# Patient Record
Sex: Male | Born: 1965 | Race: White | Hispanic: No | Smoking: Current every day smoker
Health system: Southern US, Community
[De-identification: ages and names within clinical notes are randomized; demographics above are authoritative.]

## PROBLEM LIST (undated history)

## (undated) DIAGNOSIS — R569 Unspecified convulsions: Secondary | ICD-10-CM

## (undated) DIAGNOSIS — Z720 Tobacco use: Secondary | ICD-10-CM

## (undated) DIAGNOSIS — F32A Depression, unspecified: Secondary | ICD-10-CM

## (undated) DIAGNOSIS — F1210 Cannabis abuse, uncomplicated: Secondary | ICD-10-CM

## (undated) HISTORY — PX: OTHER SURGICAL HISTORY: SHX169

## (undated) HISTORY — DX: Unspecified convulsions: R56.9

## (undated) HISTORY — DX: Depression, unspecified: F32.A

---

## 2015-12-28 DIAGNOSIS — E785 Hyperlipidemia, unspecified: Secondary | ICD-10-CM | POA: Insufficient documentation

## 2015-12-28 DIAGNOSIS — F17200 Nicotine dependence, unspecified, uncomplicated: Secondary | ICD-10-CM | POA: Insufficient documentation

## 2015-12-28 DIAGNOSIS — N529 Male erectile dysfunction, unspecified: Secondary | ICD-10-CM | POA: Insufficient documentation

## 2016-01-19 DIAGNOSIS — F32A Depression, unspecified: Secondary | ICD-10-CM | POA: Insufficient documentation

## 2019-10-01 DIAGNOSIS — J869 Pyothorax without fistula: Secondary | ICD-10-CM

## 2019-10-01 DIAGNOSIS — J90 Pleural effusion, not elsewhere classified: Secondary | ICD-10-CM

## 2019-10-01 NOTE — Plan of Care (Signed)
Hospitalist Accept Note:  Transfer from [place]. Patient with loculated right pleural effusion. WBC 23.9. Afebrile and normotensive. Unclear etiology for development of complex pleural effusion. Needs CT Surgery consult for chest tube placement.  Bennie Pierini, MD 10/01/19 11:56 PM

## 2019-10-02 ENCOUNTER — Encounter (HOSPITAL_COMMUNITY): Payer: Self-pay | Admitting: Internal Medicine

## 2019-10-02 ENCOUNTER — Inpatient Hospital Stay (HOSPITAL_COMMUNITY)
Admission: AD | Admit: 2019-10-02 | Discharge: 2019-10-12 | DRG: 164 | Disposition: A | Discharge status: Home / Self Care | Payer: Medicaid Other | Source: Other Acute Inpatient Hospital | Attending: Surgery | Admitting: Surgery

## 2019-10-02 ENCOUNTER — Other Ambulatory Visit: Payer: Self-pay

## 2019-10-02 DIAGNOSIS — J90 Pleural effusion, not elsewhere classified: Secondary | ICD-10-CM | POA: Diagnosis present

## 2019-10-02 DIAGNOSIS — F1210 Cannabis abuse, uncomplicated: Secondary | ICD-10-CM | POA: Diagnosis present

## 2019-10-02 DIAGNOSIS — Z825 Family history of asthma and other chronic lower respiratory diseases: Secondary | ICD-10-CM

## 2019-10-02 DIAGNOSIS — R161 Splenomegaly, not elsewhere classified: Secondary | ICD-10-CM | POA: Diagnosis present

## 2019-10-02 DIAGNOSIS — Z8249 Family history of ischemic heart disease and other diseases of the circulatory system: Secondary | ICD-10-CM

## 2019-10-02 DIAGNOSIS — K209 Esophagitis, unspecified without bleeding: Secondary | ICD-10-CM | POA: Diagnosis present

## 2019-10-02 DIAGNOSIS — Z20828 Contact with and (suspected) exposure to other viral communicable diseases: Secondary | ICD-10-CM | POA: Diagnosis present

## 2019-10-02 DIAGNOSIS — K3189 Other diseases of stomach and duodenum: Secondary | ICD-10-CM | POA: Diagnosis present

## 2019-10-02 DIAGNOSIS — R001 Bradycardia, unspecified: Secondary | ICD-10-CM | POA: Diagnosis not present

## 2019-10-02 DIAGNOSIS — F17210 Nicotine dependence, cigarettes, uncomplicated: Secondary | ICD-10-CM | POA: Diagnosis present

## 2019-10-02 DIAGNOSIS — Z791 Long term (current) use of non-steroidal anti-inflammatories (NSAID): Secondary | ICD-10-CM | POA: Diagnosis not present

## 2019-10-02 DIAGNOSIS — E279 Disorder of adrenal gland, unspecified: Secondary | ICD-10-CM | POA: Diagnosis present

## 2019-10-02 DIAGNOSIS — Z720 Tobacco use: Secondary | ICD-10-CM | POA: Diagnosis present

## 2019-10-02 DIAGNOSIS — E860 Dehydration: Secondary | ICD-10-CM | POA: Diagnosis present

## 2019-10-02 DIAGNOSIS — J869 Pyothorax without fistula: Secondary | ICD-10-CM | POA: Diagnosis present

## 2019-10-02 DIAGNOSIS — E278 Other specified disorders of adrenal gland: Secondary | ICD-10-CM | POA: Diagnosis present

## 2019-10-02 DIAGNOSIS — J918 Pleural effusion in other conditions classified elsewhere: Secondary | ICD-10-CM

## 2019-10-02 DIAGNOSIS — I973 Postprocedural hypertension: Secondary | ICD-10-CM | POA: Diagnosis not present

## 2019-10-02 DIAGNOSIS — N179 Acute kidney failure, unspecified: Secondary | ICD-10-CM | POA: Diagnosis not present

## 2019-10-02 DIAGNOSIS — E876 Hypokalemia: Secondary | ICD-10-CM | POA: Diagnosis not present

## 2019-10-02 DIAGNOSIS — G8929 Other chronic pain: Secondary | ICD-10-CM | POA: Diagnosis present

## 2019-10-02 DIAGNOSIS — J939 Pneumothorax, unspecified: Secondary | ICD-10-CM

## 2019-10-02 DIAGNOSIS — Z9889 Other specified postprocedural states: Secondary | ICD-10-CM

## 2019-10-02 HISTORY — DX: Tobacco use: Z72.0

## 2019-10-02 HISTORY — DX: Cannabis abuse, uncomplicated: F12.10

## 2019-10-02 LAB — HIV ANTIBODY (ROUTINE TESTING W REFLEX): HIV Screen 4th Generation wRfx: NONREACTIVE

## 2019-10-02 LAB — CREATININE, SERUM
Creatinine, Ser: 0.98 mg/dL (ref 0.61–1.24)
GFR calc Af Amer: >60 mL/min (ref >60)
GFR calc non Af Amer: >60 mL/min (ref >60)

## 2019-10-02 LAB — SARS CORONAVIRUS 2 (TAT 6-24 HRS): SARS Coronavirus 2: NEGATIVE

## 2019-10-02 MED ORDER — ACETAMINOPHEN 650 MG RE SUPP: 650.0000 mg | Freq: Four times a day (QID) | RECTAL | Status: DC | PRN

## 2019-10-02 MED ORDER — DM-GUAIFENESIN ER 30-600 MG PO TB12
1.0000 | ORAL_TABLET | Freq: Two times a day (BID) | ORAL | Status: DC
  Administered 2019-10-02 – 2019-10-03 (×3): 1 via ORAL
  Filled 2019-10-02 (×3): qty 1

## 2019-10-02 MED ORDER — VANCOMYCIN HCL 10 G IV SOLR
1250.0000 mg | Freq: Two times a day (BID) | INTRAVENOUS | Status: DC
  Administered 2019-10-03: 1250 mg via INTRAVENOUS
  Filled 2019-10-02 (×3): qty 1250

## 2019-10-02 MED ORDER — SODIUM CHLORIDE 0.9 % IV SOLN
INTRAVENOUS | Status: DC | PRN
  Administered 2019-10-02: 1000 mL via INTRAVENOUS

## 2019-10-02 MED ORDER — ONDANSETRON HCL 4 MG PO TABS: 4.0000 mg | ORAL_TABLET | Freq: Four times a day (QID) | ORAL | Status: DC | PRN

## 2019-10-02 MED ORDER — IPRATROPIUM-ALBUTEROL 0.5-2.5 (3) MG/3ML IN SOLN: 3.0000 mL | RESPIRATORY_TRACT | Status: DC | PRN

## 2019-10-02 MED ORDER — ACETAMINOPHEN 325 MG PO TABS
650.0000 mg | ORAL_TABLET | Freq: Four times a day (QID) | ORAL | Status: DC | PRN
  Administered 2019-10-03 (×2): 650 mg via ORAL
  Filled 2019-10-02 (×2): qty 2

## 2019-10-02 MED ORDER — ONDANSETRON HCL 4 MG/2ML IJ SOLN
4.0000 mg | Freq: Four times a day (QID) | INTRAMUSCULAR | Status: DC | PRN
  Administered 2019-10-04: 4 mg via INTRAVENOUS
  Filled 2019-10-02: qty 2

## 2019-10-02 MED ORDER — PIPERACILLIN-TAZOBACTAM 3.375 G IVPB
3.3750 g | Freq: Three times a day (TID) | INTRAVENOUS | Status: DC
  Administered 2019-10-03 – 2019-10-08 (×17): 3.375 g via INTRAVENOUS
  Filled 2019-10-02 (×20): qty 50

## 2019-10-02 MED ORDER — KETOROLAC TROMETHAMINE 30 MG/ML IJ SOLN: 30.0000 mg | Freq: Four times a day (QID) | INTRAMUSCULAR | Status: DC | PRN

## 2019-10-02 NOTE — Progress Notes (Addendum)
Pharmacy Antibiotic Note  Nathan [name] is a 53 y.o. male admitted on 10/02/2019 as a transfer from [facility] after presentation with right-sided CP, cough and SOB.  Chest [facility] at [facility] showed mod-large loculated right-sided pleural effusion and was concerning for RLL pneumonia. Pharmacy has been consulted for Zosyn and vancomycin dosing.  Medical history includes: tobacco and marijuana use, splenomegaly, right adrenal nodule  OSH labs: WBC trended up overnight from 23,000-26,000; procalcitonin trended up from 0.77-0.86; bld cx obtained/pending  Per pharmacist at [facility], pt rec'd the following antibiotics:  Vancomycin 2 gm IV X 1 at 15:07 PM today Zosyn 3.375 gm IV last dose at 14:16 PM today  Scr 0.98 this evening; CrCl 92.8 ml/min   Plan: Zosyn 3.375 gm IV Q 8 hrs, next dose at 2200 this evening Vancomycin 1250 mg IV Q 12 hrs (estimated vancomycin AUC on this regimen, using Scr 0.98, is 538.9, goal vancomycin AUC is 400-550), first dose ~12 hrs after 2 gm dose rec'd at OSH Monitor WBC, temp, clinical improvement, renal function, cultures, vancomycin levels as indicated  Height: 5\' 11"  (180.3 cm) Weight: 174 lb 4.8 oz (79.1 kg) IBW/kg (Calculated) : 75.3  Temp (24hrs), Avg:99.1 F (37.3 C), Min:99.1 F (37.3 C), Max:99.1 F (37.3 C)   No Known Allergies  Microbiology results: 11/25 COVID: pending (per Epic note, pt tested positive at OSH) Bld cx at OSH: pending  Thank you for allowing pharmacy to be a part of this patient's care.  Gillermina Hu, PharmD, BCPS, [employer] Clinical Pharmacist 10/02/2019 6:57 PM
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
## 2019-10-03 ENCOUNTER — Inpatient Hospital Stay (HOSPITAL_COMMUNITY): Payer: Medicaid Other

## 2019-10-03 LAB — CBC
HCT: 44.4 % (ref 39.0–52.0)
Hemoglobin: 15.1 g/dL (ref 13.0–17.0)
MCH: 29.7 pg (ref 26.0–34.0)
MCHC: 34 g/dL (ref 30.0–36.0)
MCV: 87.4 fL (ref 80.0–100.0)
Platelets: 319 10*3/uL (ref 150–400)
RBC: 5.08 MIL/uL (ref 4.22–5.81)
RDW: 12.9 % (ref 11.5–15.5)
WBC: 26.5 10*3/uL — ABNORMAL HIGH (ref 4.0–10.5)
nRBC: 0 % (ref 0.0–0.2)

## 2019-10-03 LAB — BASIC METABOLIC PANEL
Anion gap: 11 (ref 5–15)
BUN: 18 mg/dL (ref 6–20)
CO2: 20 mmol/L — ABNORMAL LOW (ref 22–32)
Calcium: 8.5 mg/dL — ABNORMAL LOW (ref 8.9–10.3)
Chloride: 104 mmol/L (ref 98–111)
Creatinine, Ser: 1.63 mg/dL — ABNORMAL HIGH (ref 0.61–1.24)
GFR calc Af Amer: 55 mL/min — ABNORMAL LOW (ref >60)
GFR calc non Af Amer: 47 mL/min — ABNORMAL LOW (ref >60)
Glucose, Bld: 114 mg/dL — ABNORMAL HIGH (ref 70–99)
Potassium: 3.8 mmol/L (ref 3.5–5.1)
Sodium: 135 mmol/L (ref 135–145)

## 2019-10-03 LAB — MRSA PCR SCREENING: MRSA by PCR: NEGATIVE

## 2019-10-03 LAB — EXPECTORATED SPUTUM ASSESSMENT W GRAM STAIN, RFLX TO RESP C: Special Requests: NORMAL

## 2019-10-03 MED ORDER — NICOTINE 21 MG/24HR TD PT24
21.0000 mg | MEDICATED_PATCH | Freq: Every day | TRANSDERMAL | Status: DC
  Administered 2019-10-03 – 2019-10-08 (×5): 21 mg via TRANSDERMAL
  Filled 2019-10-03 (×6): qty 1

## 2019-10-03 MED ORDER — SODIUM CHLORIDE 0.9 % IV SOLN
INTRAVENOUS | Status: DC
  Administered 2019-10-03: 10:00:00 via INTRAVENOUS

## 2019-10-03 MED ORDER — HEPARIN SODIUM (PORCINE) 5000 UNIT/ML IJ SOLN
5000.0000 [IU] | Freq: Three times a day (TID) | INTRAMUSCULAR | Status: DC
  Administered 2019-10-03 – 2019-10-04 (×3): 5000 [IU] via SUBCUTANEOUS
  Filled 2019-10-03 (×3): qty 1

## 2019-10-03 MED ORDER — ENSURE ENLIVE PO LIQD
237.0000 mL | Freq: Three times a day (TID) | ORAL | Status: DC
  Administered 2019-10-03 (×2): 237 mL via ORAL
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
## 2019-10-04 ENCOUNTER — Encounter (HOSPITAL_COMMUNITY): Payer: Self-pay | Admitting: Certified Registered"

## 2019-10-04 ENCOUNTER — Inpatient Hospital Stay (HOSPITAL_COMMUNITY): Payer: Medicaid Other | Admitting: Certified Registered"

## 2019-10-04 ENCOUNTER — Inpatient Hospital Stay (HOSPITAL_COMMUNITY): Payer: Medicaid Other

## 2019-10-04 ENCOUNTER — Encounter (HOSPITAL_COMMUNITY)
Admission: AD | Disposition: A | Discharge status: Home / Self Care | Payer: Self-pay | Source: Other Acute Inpatient Hospital | Attending: Surgery

## 2019-10-04 DIAGNOSIS — J869 Pyothorax without fistula: Secondary | ICD-10-CM | POA: Diagnosis present

## 2019-10-04 HISTORY — PX: EMPYEMA DRAINAGE: SHX5097

## 2019-10-04 HISTORY — PX: THORACOTOMY: SHX5074

## 2019-10-04 LAB — CBC
HCT: 43.5 % (ref 39.0–52.0)
Hemoglobin: 14.6 g/dL (ref 13.0–17.0)
MCH: 29.7 pg (ref 26.0–34.0)
MCHC: 33.6 g/dL (ref 30.0–36.0)
MCV: 88.4 fL (ref 80.0–100.0)
Platelets: 329 10*3/uL (ref 150–400)
RBC: 4.92 MIL/uL (ref 4.22–5.81)
RDW: 12.9 % (ref 11.5–15.5)
WBC: 24 10*3/uL — ABNORMAL HIGH (ref 4.0–10.5)
nRBC: 0 % (ref 0.0–0.2)

## 2019-10-04 LAB — PROTIME-INR
INR: 1.1 (ref 0.8–1.2)
Prothrombin Time: 14.5 seconds (ref 11.4–15.2)

## 2019-10-04 LAB — GLUCOSE, CAPILLARY
Glucose-Capillary: 137 mg/dL — ABNORMAL HIGH (ref 70–99)
Glucose-Capillary: 152 mg/dL — ABNORMAL HIGH (ref 70–99)
Glucose-Capillary: 171 mg/dL — ABNORMAL HIGH (ref 70–99)

## 2019-10-04 LAB — BASIC METABOLIC PANEL
Anion gap: 14 (ref 5–15)
BUN: 27 mg/dL — ABNORMAL HIGH (ref 6–20)
CO2: 24 mmol/L (ref 22–32)
Calcium: 8.7 mg/dL — ABNORMAL LOW (ref 8.9–10.3)
Chloride: 101 mmol/L (ref 98–111)
Creatinine, Ser: 2.83 mg/dL — ABNORMAL HIGH (ref 0.61–1.24)
GFR calc Af Amer: 28 mL/min — ABNORMAL LOW (ref >60)
GFR calc non Af Amer: 24 mL/min — ABNORMAL LOW (ref >60)
Glucose, Bld: 104 mg/dL — ABNORMAL HIGH (ref 70–99)
Potassium: 4.6 mmol/L (ref 3.5–5.1)
Sodium: 139 mmol/L (ref 135–145)

## 2019-10-04 LAB — LACTIC ACID, PLASMA: Lactic Acid, Venous: 1.5 mmol/L (ref 0.5–1.9)

## 2019-10-04 LAB — PROCALCITONIN: Procalcitonin: 1.38 ng/mL

## 2019-10-04 LAB — SURGICAL PCR SCREEN
MRSA, PCR: NEGATIVE
Staphylococcus aureus: NEGATIVE

## 2019-10-04 LAB — VANCOMYCIN, RANDOM: Vancomycin Rm: 13

## 2019-10-04 SURGERY — THORACOTOMY, MAJOR
Anesthesia: General | Site: Chest | Laterality: Right

## 2019-10-04 MED ORDER — ONDANSETRON HCL 4 MG/2ML IJ SOLN
INTRAMUSCULAR | Status: DC | PRN
  Administered 2019-10-04: 4 mg via INTRAVENOUS

## 2019-10-04 MED ORDER — ORAL CARE MOUTH RINSE
15.0000 mL | Freq: Two times a day (BID) | OROMUCOSAL | Status: DC
  Administered 2019-10-04 – 2019-10-12 (×8): 15 mL via OROMUCOSAL

## 2019-10-04 MED ORDER — CHLORHEXIDINE GLUCONATE CLOTH 2 % EX PADS
6.0000 | MEDICATED_PAD | Freq: Every day | CUTANEOUS | Status: DC
  Administered 2019-10-04 – 2019-10-05 (×2): 6 via TOPICAL

## 2019-10-04 MED ORDER — FENTANYL CITRATE (PF) 250 MCG/5ML IJ SOLN
INTRAMUSCULAR | Status: AC
  Filled 2019-10-04: qty 5

## 2019-10-04 MED ORDER — INSULIN ASPART 100 UNIT/ML ~~LOC~~ SOLN
0.0000 [IU] | SUBCUTANEOUS | Status: DC
  Administered 2019-10-04 (×2): 2 [IU] via SUBCUTANEOUS
  Administered 2019-10-04 – 2019-10-05 (×3): 4 [IU] via SUBCUTANEOUS

## 2019-10-04 MED ORDER — DEXAMETHASONE SODIUM PHOSPHATE 10 MG/ML IJ SOLN
INTRAMUSCULAR | Status: DC | PRN
  Administered 2019-10-04: 10 mg via INTRAVENOUS

## 2019-10-04 MED ORDER — MIDAZOLAM HCL 2 MG/2ML IJ SOLN
INTRAMUSCULAR | Status: AC
  Filled 2019-10-04: qty 2

## 2019-10-04 MED ORDER — SUCCINYLCHOLINE CHLORIDE 200 MG/10ML IV SOSY
PREFILLED_SYRINGE | INTRAVENOUS | Status: AC
  Filled 2019-10-04: qty 10

## 2019-10-04 MED ORDER — SENNOSIDES-DOCUSATE SODIUM 8.6-50 MG PO TABS
1.0000 | ORAL_TABLET | Freq: Every day | ORAL | Status: DC
  Administered 2019-10-06: 1 via ORAL
  Filled 2019-10-04 (×4): qty 1

## 2019-10-04 MED ORDER — ONDANSETRON HCL 4 MG/2ML IJ SOLN
4.0000 mg | Freq: Four times a day (QID) | INTRAMUSCULAR | Status: DC | PRN
  Administered 2019-10-05 – 2019-10-06 (×5): 4 mg via INTRAVENOUS
  Filled 2019-10-04: qty 2

## 2019-10-04 MED ORDER — LACTATED RINGERS IV SOLN
INTRAVENOUS | Status: DC | PRN
  Administered 2019-10-04: 06:00:00 via INTRAVENOUS

## 2019-10-04 MED ORDER — SODIUM CHLORIDE 0.9 % IV SOLN: INTRAVENOUS | Status: DC | PRN

## 2019-10-04 MED ORDER — GLYCOPYRROLATE PF 0.2 MG/ML IJ SOSY
PREFILLED_SYRINGE | INTRAMUSCULAR | Status: AC
  Filled 2019-10-04: qty 1

## 2019-10-04 MED ORDER — OXYCODONE HCL 5 MG PO TABS
5.0000 mg | ORAL_TABLET | ORAL | Status: DC | PRN
  Administered 2019-10-05 (×2): 5 mg via ORAL
  Administered 2019-10-05 – 2019-10-06 (×2): 10 mg via ORAL
  Filled 2019-10-04: qty 1
  Filled 2019-10-04: qty 2
  Filled 2019-10-04: qty 1
  Filled 2019-10-04 (×2): qty 2

## 2019-10-04 MED ORDER — METOCLOPRAMIDE HCL 5 MG/ML IJ SOLN
10.0000 mg | Freq: Four times a day (QID) | INTRAMUSCULAR | Status: AC
  Administered 2019-10-04 – 2019-10-05 (×4): 10 mg via INTRAVENOUS
  Filled 2019-10-04 (×4): qty 2

## 2019-10-04 MED ORDER — ACETAMINOPHEN 160 MG/5ML PO SOLN
1000.0000 mg | Freq: Four times a day (QID) | ORAL | Status: AC
  Administered 2019-10-07: 1000 mg via ORAL

## 2019-10-04 MED ORDER — ONDANSETRON HCL 4 MG/2ML IJ SOLN
4.0000 mg | Freq: Four times a day (QID) | INTRAMUSCULAR | Status: DC | PRN
  Administered 2019-10-04 – 2019-10-11 (×7): 4 mg via INTRAVENOUS
  Filled 2019-10-04 (×10): qty 2

## 2019-10-04 MED ORDER — TRAMADOL HCL 50 MG PO TABS
50.0000 mg | ORAL_TABLET | Freq: Four times a day (QID) | ORAL | Status: DC | PRN
  Administered 2019-10-07 (×2): 50 mg via ORAL
  Filled 2019-10-04 (×2): qty 1

## 2019-10-04 MED ORDER — ONDANSETRON HCL 4 MG/2ML IJ SOLN
INTRAMUSCULAR | Status: AC
  Filled 2019-10-04: qty 2

## 2019-10-04 MED ORDER — MIDAZOLAM HCL 5 MG/5ML IJ SOLN
INTRAMUSCULAR | Status: DC | PRN
  Administered 2019-10-04 (×2): 1 mg via INTRAVENOUS

## 2019-10-04 MED ORDER — DEXAMETHASONE SODIUM PHOSPHATE 10 MG/ML IJ SOLN
INTRAMUSCULAR | Status: AC
  Filled 2019-10-04: qty 1

## 2019-10-04 MED ORDER — ROCURONIUM BROMIDE 10 MG/ML (PF) SYRINGE
PREFILLED_SYRINGE | INTRAVENOUS | Status: AC
  Filled 2019-10-04: qty 10

## 2019-10-04 MED ORDER — SODIUM CHLORIDE 0.9% FLUSH: 9.0000 mL | INTRAVENOUS | Status: DC | PRN

## 2019-10-04 MED ORDER — ROCURONIUM BROMIDE 50 MG/5ML IV SOSY
PREFILLED_SYRINGE | INTRAVENOUS | Status: DC | PRN
  Administered 2019-10-04: 50 mg via INTRAVENOUS
  Administered 2019-10-04: 20 mg via INTRAVENOUS

## 2019-10-04 MED ORDER — LIDOCAINE 2% (20 MG/ML) 5 ML SYRINGE
INTRAMUSCULAR | Status: DC | PRN
  Administered 2019-10-04: 60 mg via INTRAVENOUS

## 2019-10-04 MED ORDER — PROPOFOL 10 MG/ML IV BOLUS
INTRAVENOUS | Status: AC
  Filled 2019-10-04: qty 20

## 2019-10-04 MED ORDER — DEXTROSE-NACL 5-0.9 % IV SOLN
INTRAVENOUS | Status: DC
  Administered 2019-10-04 – 2019-10-10 (×6): via INTRAVENOUS

## 2019-10-04 MED ORDER — PROPOFOL 10 MG/ML IV BOLUS
INTRAVENOUS | Status: DC | PRN
  Administered 2019-10-04: 130 mg via INTRAVENOUS

## 2019-10-04 MED ORDER — LIDOCAINE 2% (20 MG/ML) 5 ML SYRINGE
INTRAMUSCULAR | Status: AC
  Filled 2019-10-04: qty 5

## 2019-10-04 MED ORDER — 0.9 % SODIUM CHLORIDE (POUR BTL) OPTIME
TOPICAL | Status: DC | PRN
  Administered 2019-10-04: 2000 mL

## 2019-10-04 MED ORDER — FENTANYL CITRATE (PF) 100 MCG/2ML IJ SOLN
INTRAMUSCULAR | Status: DC | PRN
  Administered 2019-10-04 (×6): 50 ug via INTRAVENOUS
  Administered 2019-10-04 (×2): 100 ug via INTRAVENOUS

## 2019-10-04 MED ORDER — ACETAMINOPHEN 500 MG PO TABS
1000.0000 mg | ORAL_TABLET | Freq: Four times a day (QID) | ORAL | Status: AC
  Administered 2019-10-04 – 2019-10-08 (×11): 1000 mg via ORAL
  Filled 2019-10-04 (×11): qty 2

## 2019-10-04 MED ORDER — MORPHINE SULFATE 2 MG/ML IV SOLN
INTRAVENOUS | Status: DC
  Administered 2019-10-04: 10:00:00 via INTRAVENOUS
  Administered 2019-10-04: 0 mg via INTRAVENOUS
  Administered 2019-10-04: 22.5 mg via INTRAVENOUS
  Administered 2019-10-05: 0 mg via INTRAVENOUS
  Administered 2019-10-05: 7.5 mg via INTRAVENOUS
  Administered 2019-10-05: 0 mg via INTRAVENOUS
  Administered 2019-10-05: 9 mg via INTRAVENOUS
  Administered 2019-10-06: 3 mg via INTRAVENOUS
  Administered 2019-10-06: 0 mg via INTRAVENOUS
  Administered 2019-10-06: 06:00:00 via INTRAVENOUS
  Administered 2019-10-06: 4.5 mg via INTRAVENOUS
  Administered 2019-10-06: 3 mg via INTRAVENOUS
  Administered 2019-10-06: 4.5 mg via INTRAVENOUS
  Filled 2019-10-04: qty 30

## 2019-10-04 MED ORDER — SUGAMMADEX SODIUM 200 MG/2ML IV SOLN
INTRAVENOUS | Status: DC | PRN
  Administered 2019-10-04: 200 mg via INTRAVENOUS

## 2019-10-04 MED ORDER — DIPHENHYDRAMINE HCL 50 MG/ML IJ SOLN: 12.5000 mg | Freq: Four times a day (QID) | INTRAMUSCULAR | Status: DC | PRN

## 2019-10-04 MED ORDER — DEXMEDETOMIDINE HCL 200 MCG/2ML IV SOLN
INTRAVENOUS | Status: DC | PRN
  Administered 2019-10-04: 12 ug via INTRAVENOUS
  Administered 2019-10-04: 16 ug via INTRAVENOUS
  Administered 2019-10-04: 12 ug via INTRAVENOUS

## 2019-10-04 MED ORDER — LACTATED RINGERS IV SOLN
INTRAVENOUS | Status: DC | PRN
  Administered 2019-10-04 (×2): via INTRAVENOUS

## 2019-10-04 MED ORDER — NALOXONE HCL 0.4 MG/ML IJ SOLN: 0.4000 mg | INTRAMUSCULAR | Status: DC | PRN

## 2019-10-04 MED ORDER — PHENYLEPHRINE 40 MCG/ML (10ML) SYRINGE FOR IV PUSH (FOR BLOOD PRESSURE SUPPORT)
PREFILLED_SYRINGE | INTRAVENOUS | Status: AC
  Filled 2019-10-04: qty 10

## 2019-10-04 MED ORDER — DIPHENHYDRAMINE HCL 12.5 MG/5ML PO ELIX
12.5000 mg | ORAL_SOLUTION | Freq: Four times a day (QID) | ORAL | Status: DC | PRN
  Filled 2019-10-04: qty 5

## 2019-10-04 MED ORDER — CHLORHEXIDINE GLUCONATE CLOTH 2 % EX PADS
6.0000 | MEDICATED_PAD | Freq: Once | CUTANEOUS | Status: AC
  Administered 2019-10-04: 6 via TOPICAL

## 2019-10-04 MED ORDER — GLYCOPYRROLATE PF 0.2 MG/ML IJ SOSY
PREFILLED_SYRINGE | INTRAMUSCULAR | Status: DC | PRN
  Administered 2019-10-04: .1 mg via INTRAVENOUS

## 2019-10-04 MED ORDER — BISACODYL 5 MG PO TBEC
10.0000 mg | DELAYED_RELEASE_TABLET | Freq: Every day | ORAL | Status: DC
  Administered 2019-10-04 – 2019-10-07 (×4): 10 mg via ORAL
  Filled 2019-10-04 (×4): qty 2

## 2019-10-04 MED ORDER — MORPHINE SULFATE 2 MG/ML IV SOLN
INTRAVENOUS | Status: AC
  Filled 2019-10-04: qty 30

## 2019-10-04 MED ORDER — HEMOSTATIC AGENTS (NO CHARGE) OPTIME
TOPICAL | Status: DC | PRN
  Administered 2019-10-04: 1 via TOPICAL

## 2019-10-04 SURGICAL SUPPLY — 76 items
BENZOIN TINCTURE PRP APPL 2/3 (GAUZE/BANDAGES/DRESSINGS) IMPLANT
BLADE CLIPPER SURG (BLADE) IMPLANT
CANISTER SUCT 3000ML PPV (MISCELLANEOUS) ×3 IMPLANT
CATH KIT ON-Q SILVERSOAK 5IN (CATHETERS) IMPLANT
CATH THORACIC 28FR (CATHETERS) ×3 IMPLANT
CATH THORACIC 36FR (CATHETERS) IMPLANT
CATH THORACIC 36FR RT ANG (CATHETERS) IMPLANT
CATH TROCAR 28FR (CATHETERS) ×3 IMPLANT
CLIP LIGATING EXTRA MED SLVR (CLIP) ×3 IMPLANT
CLIP VESOCCLUDE MED 6/CT (CLIP) ×3 IMPLANT
CONN ST 1/4X3/8  BEN (MISCELLANEOUS) ×2
CONN ST 1/4X3/8 BEN (MISCELLANEOUS) ×1 IMPLANT
CONN Y 3/8X3/8X3/8  BEN (MISCELLANEOUS)
CONN Y 3/8X3/8X3/8 BEN (MISCELLANEOUS) IMPLANT
CONT SPEC 4OZ CLIKSEAL STRL BL (MISCELLANEOUS) ×6 IMPLANT
COVER SURGICAL LIGHT HANDLE (MISCELLANEOUS) ×6 IMPLANT
COVER WAND RF STERILE (DRAPES) ×3 IMPLANT
DERMABOND ADVANCED (GAUZE/BANDAGES/DRESSINGS)
DERMABOND ADVANCED .7 DNX12 (GAUZE/BANDAGES/DRESSINGS) IMPLANT
DRAIN CHANNEL 28F RND 3/8 FF (WOUND CARE) IMPLANT
DRAIN CHANNEL 32F RND 10.7 FF (WOUND CARE) ×3 IMPLANT
DRAPE LAPAROSCOPIC ABDOMINAL (DRAPES) ×3 IMPLANT
DRAPE WARM FLUID 44X44 (DRAPES) ×3 IMPLANT
ELECT BLADE 6.5 EXT (BLADE) ×3 IMPLANT
ELECT REM PT RETURN 9FT ADLT (ELECTROSURGICAL) ×3
ELECTRODE REM PT RTRN 9FT ADLT (ELECTROSURGICAL) ×1 IMPLANT
GAUZE SPONGE 4X4 12PLY STRL (GAUZE/BANDAGES/DRESSINGS) ×3 IMPLANT
GLOVE BIO SURGEON STRL SZ 6 (GLOVE) ×3 IMPLANT
GLOVE BIO SURGEON STRL SZ7 (GLOVE) ×3 IMPLANT
GLOVE BIOGEL PI IND STRL 6 (GLOVE) ×2 IMPLANT
GLOVE BIOGEL PI INDICATOR 6 (GLOVE) ×4
GLOVE EUDERMIC 7 POWDERFREE (GLOVE) ×6 IMPLANT
GLOVE SURG SS PI 7.5 STRL IVOR (GLOVE) ×3 IMPLANT
GOWN STRL REUS W/ TWL LRG LVL3 (GOWN DISPOSABLE) ×2 IMPLANT
GOWN STRL REUS W/ TWL XL LVL3 (GOWN DISPOSABLE) ×1 IMPLANT
GOWN STRL REUS W/TWL LRG LVL3 (GOWN DISPOSABLE) ×4
GOWN STRL REUS W/TWL XL LVL3 (GOWN DISPOSABLE) ×2
KIT BASIN OR (CUSTOM PROCEDURE TRAY) ×3 IMPLANT
KIT SUCTION CATH 14FR (SUCTIONS) ×3 IMPLANT
KIT TURNOVER KIT B (KITS) ×3 IMPLANT
NS IRRIG 1000ML POUR BTL (IV SOLUTION) ×9 IMPLANT
PACK CHEST (CUSTOM PROCEDURE TRAY) ×3 IMPLANT
PAD ARMBOARD 7.5X6 YLW CONV (MISCELLANEOUS) ×6 IMPLANT
PASSER SUT SWANSON 36MM LOOP (INSTRUMENTS) IMPLANT
SEALANT SURG COSEAL 4ML (VASCULAR PRODUCTS) IMPLANT
SEALANT SURG COSEAL 8ML (VASCULAR PRODUCTS) ×3 IMPLANT
SOL ANTI FOG 6CC (MISCELLANEOUS) ×2 IMPLANT
SOLUTION ANTI FOG 6CC (MISCELLANEOUS) ×4
STAPLER VISISTAT 35W (STAPLE) ×3 IMPLANT
SUT PROLENE 3 0 SH DA (SUTURE) IMPLANT
SUT SILK  1 MH (SUTURE) ×4
SUT SILK 1 MH (SUTURE) ×2 IMPLANT
SUT SILK 1 TIES 10X30 (SUTURE) IMPLANT
SUT SILK 2 0 SH (SUTURE) ×6 IMPLANT
SUT SILK 2 0SH CR/8 30 (SUTURE) IMPLANT
SUT SILK 3 0SH CR/8 30 (SUTURE) IMPLANT
SUT VIC AB 1 CTX 36 (SUTURE) ×2
SUT VIC AB 1 CTX36XBRD ANBCTR (SUTURE) ×1 IMPLANT
SUT VIC AB 2-0 CT1 27 (SUTURE)
SUT VIC AB 2-0 CT1 TAPERPNT 27 (SUTURE) IMPLANT
SUT VIC AB 2-0 CTX 36 (SUTURE) ×3 IMPLANT
SUT VIC AB 2-0 UR6 27 (SUTURE) IMPLANT
SUT VIC AB 3-0 MH 27 (SUTURE) IMPLANT
SUT VIC AB 3-0 SH 27 (SUTURE) ×6
SUT VIC AB 3-0 SH 27X BRD (SUTURE) ×3 IMPLANT
SUT VIC AB 3-0 X1 27 (SUTURE) ×3 IMPLANT
SUT VICRYL 2 TP 1 (SUTURE) ×6 IMPLANT
SYSTEM SAHARA CHEST DRAIN ATS (WOUND CARE) ×3 IMPLANT
TAPE CLOTH SURG 4X10 WHT LF (GAUZE/BANDAGES/DRESSINGS) ×3 IMPLANT
TIP APPLICATOR SPRAY EXTEND 16 (VASCULAR PRODUCTS) IMPLANT
TOWEL GREEN STERILE (TOWEL DISPOSABLE) ×3 IMPLANT
TOWEL GREEN STERILE FF (TOWEL DISPOSABLE) ×3 IMPLANT
TRAP SPECIMEN MUCOUS 40CC (MISCELLANEOUS) ×6 IMPLANT
TRAY FOLEY MTR SLVR 14FR STAT (SET/KITS/TRAYS/PACK) ×3 IMPLANT
TUNNELER SHEATH ON-Q 11GX8 DSP (PAIN MANAGEMENT) IMPLANT
WATER STERILE IRR 1000ML POUR (IV SOLUTION) ×6 IMPLANT

## 2019-10-04 NOTE — Anesthesia Postprocedure Evaluation (Signed)
Anesthesia Post Note  Patient: Nathan [name]  Procedure(s) Performed: THORACOTOMY (Right Chest) EMPYEMA DRAINAGE (Right Chest)     Patient location during evaluation: PACU Anesthesia Type: General Level of consciousness: awake and alert Pain management: pain level controlled Vital Signs Assessment: post-procedure vital signs reviewed and stable Respiratory status: spontaneous breathing, nonlabored ventilation, respiratory function stable and patient connected to nasal cannula oxygen Cardiovascular status: blood pressure returned to baseline and stable Postop Assessment: no apparent nausea or vomiting Anesthetic complications: no    Last Vitals:  Vitals:   10/04/19 1300 10/04/19 1315  BP: 116/74   Pulse:    Resp: 15 15  Temp:    SpO2: 96% 97%    Last Pain:  Vitals:   10/04/19 1200  TempSrc:   PainSc: [place]

## 2019-10-04 NOTE — Anesthesia Procedure Notes (Addendum)
Procedure Name: Intubation Date/Time: 10/04/2019 7:51 AM Performed by: Orlie Dakin, CRNA Pre-anesthesia Checklist: Patient identified, Emergency Drugs available, Suction available and Patient being monitored Patient Re-evaluated:Patient Re-evaluated prior to induction Oxygen Delivery Method: Circle system utilized Preoxygenation: Pre-oxygenation with 100% oxygen Induction Type: IV induction Ventilation: Mask ventilation without difficulty Laryngoscope Size: Miller and 3 Grade View: Grade II Tube type: Oral Endobronchial tube: Left, Double lumen EBT and EBT position confirmed by fiberoptic bronchoscope and 39 Fr Number of attempts: 1 Airway Equipment and Method: Stylet and Fiberoptic brochoscope Placement Confirmation: ETT inserted through vocal cords under direct vision and positive ETCO2 Tube secured with: Tape Dental Injury: Teeth and Oropharynx as per pre-operative assessment  Comments: Viva-sight EBT used, re-positioned with disp bronch.

## 2019-10-04 NOTE — Progress Notes (Signed)
     [address]       [place] [address]             [phone]       Resting comfortably Stable pressures Minimal CT output

## 2019-10-04 NOTE — Progress Notes (Signed)
TCTS  Had stable day yesterday but still has cough and shortness of breath. No fever but WBC 24K.  Creat up to 2.83 despite IVF, probably due to dehydration, infection. Will need to be careful with vancomycin. Ready for surgical drainage this am.

## 2019-10-04 NOTE — Brief Op Note (Signed)
10/02/2019 - 10/04/2019  9:53 AM  PATIENT:  Ophelia Charter III  53 y.o. male  PRE-OPERATIVE DIAGNOSIS:  right empyema  POST-OPERATIVE DIAGNOSIS:  right empyema  PROCEDURE:  Procedure(s): THORACOTOMY (Right) EMPYEMA DRAINAGE (Right)  SURGEON:  Surgeon(s) and Role:    * Bartle, Fernande Boyden, MD - Primary  PHYSICIAN ASSISTANT: Enid Cutter, PA-C   ANESTHESIA:   general  EBL:  25 mL   BLOOD ADMINISTERED:none  DRAINS: 28 F chest tube posteriorly to the apex, 32 F Blake posteriorly at base.   LOCAL MEDICATIONS USED:  NONE  SPECIMEN:  Source of Specimen:  right pleural fluid and right pleural peel for culture  DISPOSITION OF SPECIMEN:  micro  COUNTS:  YES  TOURNIQUET:  * No tourniquets in log *  DICTATION: .Note written in EPIC  PLAN OF CARE: Admit to inpatient   PATIENT DISPOSITION:  PACU - hemodynamically stable.   Delay start of Pharmacological VTE agent (>24hrs) due to surgical blood loss or risk of bleeding: yes

## 2019-10-04 NOTE — Transfer of Care (Signed)
Immediate Anesthesia Transfer of Care Note  Patient: Nathan [name]  Procedure(s) Performed: THORACOTOMY (Right Chest) EMPYEMA DRAINAGE (Right Chest)  Patient Location: PACU  Anesthesia Type:General  Level of Consciousness: drowsy  Airway & Oxygen Therapy: Patient Spontanous Breathing and Patient connected to face mask oxygen  Post-op Assessment: Report given to RN and Post -op Vital signs reviewed and stable  Post vital signs: Reviewed and stable  Last Vitals:  Vitals Value Taken Time  BP 104/64 10/04/19 1014  Temp    Pulse 83 10/04/19 1016  Resp 23 10/04/19 1016  SpO2 96 % 10/04/19 1016  Vitals shown include unvalidated device data.  Last Pain:  Vitals:   10/04/19 0414  TempSrc: Oral  PainSc:       Patients Stated Pain Goal: 2 (16/10/96 0454)  Complications: No apparent anesthesia complications

## 2019-10-04 NOTE — Anesthesia Procedure Notes (Signed)
Arterial Line Insertion Start/End11/27/2020 6:50 AM, 10/04/2019 6:55 AM Performed by: Orlie Dakin, CRNA, CRNA  Patient location: Pre-op. Preanesthetic checklist: patient identified, IV checked, risks and benefits discussed, surgical consent, monitors and equipment checked, pre-op evaluation and timeout performed Lidocaine 1% used for infiltration and patient sedated Left, radial was placed Catheter size: 20 G Hand hygiene performed  and maximum sterile barriers used   Attempts: 1 Procedure performed without using ultrasound guided technique. Following insertion, dressing applied and Biopatch. Post procedure assessment: normal  Patient tolerated the procedure well with no immediate complications.

## 2019-10-04 NOTE — Progress Notes (Signed)
RN called OR about Thoracotomy prep orders. Will clip right side of chest/back. Consent signed, surgical PCR negative, pt NPO, and COVID negative. Will continue to monitor.

## 2019-10-04 NOTE — Anesthesia Preprocedure Evaluation (Addendum)
Anesthesia Evaluation  Patient identified by MRN, date of birth, ID band Patient awake    Reviewed: Allergy & Precautions, NPO status , Patient's Chart, lab work & pertinent test results  Airway Mallampati: II  TM Distance: >3 FB Neck ROM: Full    Dental  (+) Poor Dentition, Missing, Dental Advisory Given   Pulmonary Current Smoker,    breath sounds clear to auscultation       Cardiovascular negative cardio ROS   Rhythm:Regular Rate:Normal     Neuro/Psych negative neurological ROS  negative psych ROS   GI/Hepatic negative GI ROS, (+)     substance abuse  marijuana use,   Endo/Other  negative endocrine ROS  Renal/GU Renal InsufficiencyRenal disease     Musculoskeletal   Abdominal Normal abdominal exam  (+)   Peds  Hematology negative hematology ROS (+)   Anesthesia Other Findings   Reproductive/Obstetrics                            Anesthesia Physical Anesthesia Plan  ASA: II  Anesthesia Plan: General   Post-op Pain Management:    Induction: Intravenous  PONV Risk Score and Plan: 2 and Ondansetron, Dexamethasone and Midazolam  Airway Management Planned: Double Lumen EBT  Additional Equipment: Arterial line  Intra-op Plan:   Post-operative Plan: Extubation in OR  Informed Consent:   Plan Discussed with: CRNA  Anesthesia Plan Comments:         Anesthesia Quick Evaluation

## 2019-10-04 NOTE — Op Note (Signed)
10/04/2019 Nathan [name] [id number]  Surgeon: Gaye Pollack, MD   First Assistant: Enid Cutter, PA-C  Preoperative Diagnosis: Right empyema   Postoperative Diagnosis: Right empyema   Procedure:  1. Right muscle-sparing thoracotomy  2. Drainage of empyema  3. Decortication of the right lung   Anesthesia: General Endotracheal   Clinical History/Surgical Indication:   This 53 year old gentleman has a moderate sized loculated right pleural effusion with right lower lobe atelectasis/infiltrate consistent with probable right lung pneumonia and empyema.  He was Covid negative.  This empyema will require surgical drainage by limited right thoracotomy to allow complete drainage and decortication of the lung.  He has been started on antibiotics.  I discussed the operative procedure with him including alternatives, benefits, and risk including but not limited to bleeding, blood transfusion, injury to the lung, persistent air leak, pleural space complications, wound infection, and recurrent effusion.  He understands all this and agrees to proceed.   Preparation:  The patient was seen in the preoperative holding area and the correct patient, correct operation, correct operative sidewere confirmed with the patient after reviewing the medical record and CT scan. The consent was signed by me. Preoperative antibiotics were given. The right side of the chest was signed by me. The patient was taken back to the operating room and positioned supine on the operating room table. After being placed under general endotracheal anesthesia by the anesthesia team using a double lumen tube a foley catheter was placed. The patient was turned into the left lateral decubitus position. The chest was prepped with betadine soap and solution. A surgical time-out was taken and the correct patient,operative side, and operative procedure were confirmed with the nursing and anesthesia staff.   Operative  Procedure:  A short lateral thoracotomy incision was made and the chest entered through the 5th ICS. The pleural space was filled with a yellowish green multiloculated empyema that was sero-purulent with a thick fibrinous peel over the lung. The empyema was completely drained and the right lung decorticated. This allowed complete expansion of the lung. The chest was irrigated with warm saline. Hemostasis was complete. A 32 F Bard drains was placed through separate stab incision and was positioned posteriorly in the pleural space. A 28 F chest tube was placed posteriorly and up to the apex of the chest. The ribs were reapproximated with # 2 vicryl pericostal sutures. The muscles were returned to their normal anatomic position. The subcutaneous tissue was closed with 2-0 vicryl continuous suture. The skin was closed with staples since there was a purulent empyema.  All sponge, needle, and instrument counts were reported correct at the end of the case. Dry sterile dressings were placed over the incisions and around the chest tubes which were connected to pleurevac suction. The patient was turned supine, extubated,then transported to the PACU in satisfactory and stable condition.

## 2019-10-05 ENCOUNTER — Inpatient Hospital Stay (HOSPITAL_COMMUNITY): Payer: Medicaid Other

## 2019-10-05 ENCOUNTER — Encounter (HOSPITAL_COMMUNITY): Payer: Self-pay | Admitting: Surgery

## 2019-10-05 LAB — CBC
HCT: 38.2 % — ABNORMAL LOW (ref 39.0–52.0)
Hemoglobin: 12.6 g/dL — ABNORMAL LOW (ref 13.0–17.0)
MCH: 29.7 pg (ref 26.0–34.0)
MCHC: 33 g/dL (ref 30.0–36.0)
MCV: 90.1 fL (ref 80.0–100.0)
Platelets: 321 10*3/uL (ref 150–400)
RBC: 4.24 MIL/uL (ref 4.22–5.81)
RDW: 13.2 % (ref 11.5–15.5)
WBC: 21 10*3/uL — ABNORMAL HIGH (ref 4.0–10.5)
nRBC: 0 % (ref 0.0–0.2)

## 2019-10-05 LAB — POCT I-STAT 7, (LYTES, BLD GAS, ICA,H+H)
Acid-base deficit: 3 mmol/L — ABNORMAL HIGH (ref 0.0–2.0)
Bicarbonate: 22.2 mmol/L (ref 20.0–28.0)
Calcium, Ion: 1.09 mmol/L — ABNORMAL LOW (ref 1.15–1.40)
HCT: 36 % — ABNORMAL LOW (ref 39.0–52.0)
Hemoglobin: 12.2 g/dL — ABNORMAL LOW (ref 13.0–17.0)
O2 Saturation: 97 %
Potassium: 4.3 mmol/L (ref 3.5–5.1)
Sodium: 136 mmol/L (ref 135–145)
TCO2: 23 mmol/L (ref 22–32)
pCO2 arterial: 40.3 mmHg (ref 32.0–48.0)
pH, Arterial: 7.349 — ABNORMAL LOW (ref 7.350–7.450)
pO2, Arterial: 90 mmHg (ref 83.0–108.0)

## 2019-10-05 LAB — BASIC METABOLIC PANEL
Anion gap: 10 (ref 5–15)
BUN: 30 mg/dL — ABNORMAL HIGH (ref 6–20)
CO2: 22 mmol/L (ref 22–32)
Calcium: 8 mg/dL — ABNORMAL LOW (ref 8.9–10.3)
Chloride: 106 mmol/L (ref 98–111)
Creatinine, Ser: 2.87 mg/dL — ABNORMAL HIGH (ref 0.61–1.24)
GFR calc Af Amer: 28 mL/min — ABNORMAL LOW (ref >60)
GFR calc non Af Amer: 24 mL/min — ABNORMAL LOW (ref >60)
Glucose, Bld: 157 mg/dL — ABNORMAL HIGH (ref 70–99)
Potassium: 4.4 mmol/L (ref 3.5–5.1)
Sodium: 138 mmol/L (ref 135–145)

## 2019-10-05 LAB — GLUCOSE, CAPILLARY
Glucose-Capillary: 105 mg/dL — ABNORMAL HIGH (ref 70–99)
Glucose-Capillary: 114 mg/dL — ABNORMAL HIGH (ref 70–99)
Glucose-Capillary: 115 mg/dL — ABNORMAL HIGH (ref 70–99)
Glucose-Capillary: 123 mg/dL — ABNORMAL HIGH (ref 70–99)
Glucose-Capillary: 169 mg/dL — ABNORMAL HIGH (ref 70–99)
Glucose-Capillary: 183 mg/dL — ABNORMAL HIGH (ref 70–99)

## 2019-10-05 LAB — ACID FAST SMEAR (AFB, MYCOBACTERIA): Acid Fast Smear: NEGATIVE

## 2019-10-05 MED ORDER — SODIUM CHLORIDE 0.9% FLUSH: 3.0000 mL | INTRAVENOUS | Status: DC | PRN

## 2019-10-05 MED ORDER — ALBUMIN HUMAN 5 % IV SOLN
INTRAVENOUS | Status: AC
  Filled 2019-10-05: qty 250

## 2019-10-05 MED ORDER — SODIUM CHLORIDE 0.9% FLUSH
3.0000 mL | Freq: Two times a day (BID) | INTRAVENOUS | Status: DC
  Administered 2019-10-05 – 2019-10-12 (×7): 3 mL via INTRAVENOUS

## 2019-10-05 MED ORDER — SODIUM CHLORIDE 0.9 % IV SOLN: 250.0000 mL | INTRAVENOUS | Status: DC | PRN

## 2019-10-05 NOTE — Progress Notes (Signed)
      [address]       [place] [address]             [phone]                 1 Day Post-Op Procedure(s) (LRB): THORACOTOMY (Right) EMPYEMA DRAINAGE (Right)   Events: No events overnight _______________________________________________________________ Vitals: BP 114/60   Pulse (!) 56   Temp 97.6 F (36.4 C) (Oral)   Resp 19   Ht 5\' 11"  (1.803 m)   Wt 80.8 kg   SpO2 98%   BMI 24.84 kg/m   - Neuro: alert NAD  - Cardiovascular: sinus brady, good hemodynamics  Drips: none.      - Pulm: CT to suction.  No leak,     ABG    Component Value Date/Time   PHART 7.349 (L) 10/05/2019 0502   PCO2ART 40.3 10/05/2019 0502   PO2ART 90.0 10/05/2019 0502   HCO3 22.2 10/05/2019 0502   TCO2 23 10/05/2019 0502   ACIDBASEDEF 3.0 (H) 10/05/2019 0502   O2SAT 97.0 10/05/2019 0502    - Abd: soft - Extremity: warm  .Intake/Output      11/27 0701 - 11/28 0700 11/28 0701 - 11/29 0700   I.V. (mL/kg) 4969.1 (61.5) 122.2 (1.5)   IV Piggyback 197 12.4   Total Intake(mL/kg) 5166.1 (63.9) 134.6 (1.7)   Urine (mL/kg/hr) 1635 (0.8) 100 (0.7)   Stool     Blood 25    Chest Tube 260    Total Output 1920 100   Net +3246.1 +34.6           _______________________________________________________________ Labs: CBC Latest Ref Rng & Units 10/05/2019 10/05/2019 10/04/2019  WBC 4.0 - 10.5 K/uL - 21.0(H) 24.0(H)  Hemoglobin 13.0 - 17.0 g/dL 12.2(L) 12.6(L) 14.6  Hematocrit 39.0 - 52.0 % 36.0(L) 38.2(L) 43.5  Platelets 150 - 400 K/uL - 321 329   CMP Latest Ref Rng & Units 10/05/2019 10/05/2019 10/04/2019  Glucose 70 - 99 mg/dL - 157(H) 104(H)  BUN 6 - 20 mg/dL - 30(H) 27(H)  Creatinine 0.61 - 1.24 mg/dL - 2.87(H) 2.83(H)  Sodium 135 - 145 mmol/L 136 138 139  Potassium 3.5 - 5.1 mmol/L 4.3 4.4 4.6  Chloride 98 - 111 mmol/L - 106 101  CO2 22 - 32 mmol/L - 22 24  Calcium 8.9 - 10.3 mg/dL - 8.0(L) 8.7(L)    CXR: Some right sided atelectasis   _______________________________________________________________  Assessment and Plan: POD 1 s/p decortication  Neuro: pain controlled CV: sinus brady, stable Pulm: continue pulm toilet.  CT drained 260.  Will keep to suction for now Renal: creat stable.  Good uop.  Will continue to follow.  Decreasing IV fluids, and encouraged PO hydration GI: ok for diet.  Encouraged PO hydration Heme: stable ID: afebrile.  WBC downtrending Dispo: 2C today  Melodie Bouillon, MD 10/05/2019 8:48 AM

## 2019-10-05 NOTE — Progress Notes (Signed)
Pt received from [facility]. Vitals checked and stable, pt has one episode of vomiting, no any complain of pain this time, PCA pump is continue. Chest tube site is CDI, attached to suction -20 cm, recent output marked, IV drip is continue @50cc , oxygen continue @2l /min, pulse oxygen is maintained at 94%, will continue to monitor the patient  Palma Holter, RN

## 2019-10-05 NOTE — Progress Notes (Signed)
Pharmacy Antibiotic Note  Nathan [name] is a 53 y.o. male admitted on 10/02/2019 as a transfer from [facility] after presentation with right-sided CP, cough and SOB.  Chest CT at [facility] showed mod-large loculated right-sided pleural effusion and was concerning for RLL pneumonia. Pharmacy has been consulted for Zosyn and vancomycin dosing.   Pt now s/p thoracotomy on 11/27. Cr has risen dramatically, vancomycin stopped, all cultures remain negative at this time.   Plan: -Continue Zosyn 3.375g IV EI q8h -Monitor cultures and kidney function   Height: 5\' 11"  (180.3 cm) Weight: 178 lb 2.1 oz (80.8 kg) IBW/kg (Calculated) : 75.3  Temp (24hrs), Avg:97.7 F (36.5 C), Min:97.5 F (36.4 C), Max:97.8 F (36.6 C)   No Known Allergies  Microbiology results: 11/24 Bcx Oval Linsey): ngtd 11/26 Bcx x 2: ngtd 11/25 COVID neg  Thank you for allowing pharmacy to be a part of this patient's care.  Arrie Senate, PharmD, BCPS Clinical Pharmacist [phone] Please check AMION for all [facility] numbers 10/05/2019

## 2019-10-06 LAB — CBC
HCT: 40.7 % (ref 39.0–52.0)
Hemoglobin: 13.1 g/dL (ref 13.0–17.0)
MCH: 29.2 pg (ref 26.0–34.0)
MCHC: 32.2 g/dL (ref 30.0–36.0)
MCV: 90.6 fL (ref 80.0–100.0)
Platelets: 366 10*3/uL (ref 150–400)
RBC: 4.49 MIL/uL (ref 4.22–5.81)
RDW: 13.2 % (ref 11.5–15.5)
WBC: 14.7 10*3/uL — ABNORMAL HIGH (ref 4.0–10.5)
nRBC: 0 % (ref 0.0–0.2)

## 2019-10-06 LAB — GLUCOSE, CAPILLARY
Glucose-Capillary: 105 mg/dL — ABNORMAL HIGH (ref 70–99)
Glucose-Capillary: 116 mg/dL — ABNORMAL HIGH (ref 70–99)
Glucose-Capillary: 87 mg/dL (ref 70–99)

## 2019-10-06 LAB — COMPREHENSIVE METABOLIC PANEL
ALT: 11 U/L (ref 0–44)
AST: 15 U/L (ref 15–41)
Albumin: 2 g/dL — ABNORMAL LOW (ref 3.5–5.0)
Alkaline Phosphatase: 61 U/L (ref 38–126)
Anion gap: 11 (ref 5–15)
BUN: 26 mg/dL — ABNORMAL HIGH (ref 6–20)
CO2: 24 mmol/L (ref 22–32)
Calcium: 8.3 mg/dL — ABNORMAL LOW (ref 8.9–10.3)
Chloride: 105 mmol/L (ref 98–111)
Creatinine, Ser: 2.83 mg/dL — ABNORMAL HIGH (ref 0.61–1.24)
GFR calc Af Amer: 28 mL/min — ABNORMAL LOW (ref >60)
GFR calc non Af Amer: 24 mL/min — ABNORMAL LOW (ref >60)
Glucose, Bld: 99 mg/dL (ref 70–99)
Potassium: 3.7 mmol/L (ref 3.5–5.1)
Sodium: 140 mmol/L (ref 135–145)
Total Bilirubin: 0.5 mg/dL (ref 0.3–1.2)
Total Protein: 5.2 g/dL — ABNORMAL LOW (ref 6.5–8.1)

## 2019-10-06 LAB — CULTURE, RESPIRATORY W GRAM STAIN
Culture: NORMAL
Special Requests: NORMAL

## 2019-10-06 MED ORDER — PROMETHAZINE HCL 25 MG/ML IJ SOLN
12.5000 mg | Freq: Four times a day (QID) | INTRAMUSCULAR | Status: DC | PRN
  Administered 2019-10-06: 12.5 mg via INTRAVENOUS
  Filled 2019-10-06 (×2): qty 1

## 2019-10-06 MED ORDER — FENTANYL CITRATE (PF) 100 MCG/2ML IJ SOLN
25.0000 ug | INTRAMUSCULAR | Status: DC | PRN
  Filled 2019-10-06: qty 2

## 2019-10-06 NOTE — Progress Notes (Signed)
      [address]       [place] [address]             [phone]                 2 Days Post-Op Procedure(s) (LRB): THORACOTOMY (Right) EMPYEMA DRAINAGE (Right)   Events: No events overnight _______________________________________________________________ Vitals: BP 130/76 (BP Location: Left Arm)   Pulse (!) 59   Temp 98.1 F (36.7 C) (Oral)   Resp 16   Ht 5\' 11"  (1.803 m)   Wt 83.9 kg   SpO2 98%   BMI 25.80 kg/m   - Neuro: alert NAD  - Cardiovascular: sinus brady, good hemodynamics     - Pulm: CT to suction.  No leak,     ABG    Component Value Date/Time   PHART 7.349 (L) 10/05/2019 0502   PCO2ART 40.3 10/05/2019 0502   PO2ART 90.0 10/05/2019 0502   HCO3 22.2 10/05/2019 0502   TCO2 23 10/05/2019 0502   ACIDBASEDEF 3.0 (H) 10/05/2019 0502   O2SAT 97.0 10/05/2019 0502    - Abd: soft - Extremity: warm  .Intake/Output      11/28 0701 - 11/29 0700 11/29 0701 - 11/30 0700   P.O. 460 240   I.V. (mL/kg) 926.8 (11)    IV Piggyback 93.5    Total Intake(mL/kg) 1480.3 (17.6) 240 (2.9)   Urine (mL/kg/hr) 1600 (0.8) 750 (2.2)   Blood     Chest Tube 190    Total Output 1790 750   Net -309.7 -510           _______________________________________________________________ Labs: CBC Latest Ref Rng & Units 10/06/2019 10/05/2019 10/05/2019  WBC 4.0 - 10.5 K/uL 14.7(H) - 21.0(H)  Hemoglobin 13.0 - 17.0 g/dL 13.1 12.2(L) 12.6(L)  Hematocrit 39.0 - 52.0 % 40.7 36.0(L) 38.2(L)  Platelets 150 - 400 K/uL 366 - 321   CMP Latest Ref Rng & Units 10/06/2019 10/05/2019 10/05/2019  Glucose 70 - 99 mg/dL 99 - 157(H)  BUN 6 - 20 mg/dL 26(H) - 30(H)  Creatinine 0.61 - 1.24 mg/dL 2.83(H) - 2.87(H)  Sodium 135 - 145 mmol/L 140 136 138  Potassium 3.5 - 5.1 mmol/L 3.7 4.3 4.4  Chloride 98 - 111 mmol/L 105 - 106  CO2 22 - 32 mmol/L 24 - 22  Calcium 8.9 - 10.3 mg/dL 8.3(L) - 8.0(L)  Total Protein 6.5 - 8.1 g/dL 5.2(L) - -  Total Bilirubin 0.3 - 1.2 mg/dL  0.5 - -  Alkaline Phos 38 - 126 U/L 61 - -  AST 15 - 41 U/L 15 - -  ALT 0 - 44 U/L 11 - -    CXR:  right sided atelectasis and effusion  _______________________________________________________________  Assessment and Plan: POD 2 s/p decortication  Neuro: pain controlled.  Will d/c pca, and switch to oral medication CV: sinus brady, stable Pulm: continue pulm toilet.  CT drained 190.  Will keep to suction for now Renal: creat down trending.  Good uop.  Will continue to follow.  Will keep IV fluids for now GI: ok for diet.  Encouraged PO hydration Heme: stable ID: afebrile.  WBC downtrending   Melodie Bouillon, MD 10/06/2019 11:01 AM

## 2019-10-06 NOTE — Progress Notes (Signed)
22 ml morphine wasted in med bin, witnessed by Neysa Hotter RN

## 2019-10-07 ENCOUNTER — Inpatient Hospital Stay (HOSPITAL_COMMUNITY): Payer: Medicaid Other

## 2019-10-07 LAB — CBC
HCT: 43.1 % (ref 39.0–52.0)
Hemoglobin: 13.9 g/dL (ref 13.0–17.0)
MCH: 29.1 pg (ref 26.0–34.0)
MCHC: 32.3 g/dL (ref 30.0–36.0)
MCV: 90.4 fL (ref 80.0–100.0)
Platelets: 366 10*3/uL (ref 150–400)
RBC: 4.77 MIL/uL (ref 4.22–5.81)
RDW: 13 % (ref 11.5–15.5)
WBC: 14.1 10*3/uL — ABNORMAL HIGH (ref 4.0–10.5)
nRBC: 0 % (ref 0.0–0.2)

## 2019-10-07 LAB — GLUCOSE, CAPILLARY
Glucose-Capillary: 111 mg/dL — ABNORMAL HIGH (ref 70–99)
Glucose-Capillary: 112 mg/dL — ABNORMAL HIGH (ref 70–99)
Glucose-Capillary: 85 mg/dL (ref 70–99)
Glucose-Capillary: 95 mg/dL (ref 70–99)
Glucose-Capillary: 96 mg/dL (ref 70–99)

## 2019-10-07 LAB — BASIC METABOLIC PANEL
Anion gap: 13 (ref 5–15)
BUN: 26 mg/dL — ABNORMAL HIGH (ref 6–20)
CO2: 22 mmol/L (ref 22–32)
Calcium: 8.3 mg/dL — ABNORMAL LOW (ref 8.9–10.3)
Chloride: 106 mmol/L (ref 98–111)
Creatinine, Ser: 2.95 mg/dL — ABNORMAL HIGH (ref 0.61–1.24)
GFR calc Af Amer: 27 mL/min — ABNORMAL LOW (ref >60)
GFR calc non Af Amer: 23 mL/min — ABNORMAL LOW (ref >60)
Glucose, Bld: 102 mg/dL — ABNORMAL HIGH (ref 70–99)
Potassium: 3.7 mmol/L (ref 3.5–5.1)
Sodium: 141 mmol/L (ref 135–145)

## 2019-10-07 NOTE — Progress Notes (Signed)
Received order however pt does not have a cardiac diagnosis. Will not follow. Yves Dill CES, ACSM 2:24 PM 10/07/2019

## 2019-10-07 NOTE — Plan of Care (Signed)

## 2019-10-07 NOTE — Discharge Summary (Signed)
Physician Discharge Summary       [address]       [place] [id number]             [phone]    Patient ID: Nathan CofferWilliam David Legendre III MRN: [id number] DOB/AGE: 53/05/1966 53 y.o.  Admit date: 10/02/2019 Discharge date: 10/15/2019  Admission Diagnoses: Right empyema  Discharge Diagnoses:  1. S/p muscle sparing right thoracotomy, drain empyema, decortication 2. Acute non oliguric renal failure secondary to pre op dehydration 3. 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 Splenomegaly 5. History of tobacco abuse 6. History of Marijuana abuse  Consults: Gastroenterology  Procedure (s):  1. Right muscle-sparing thoracotomy  2. Drainage of empyema  3. Decortication of the right lung by Dr. Laneta SimmersBartle on 10/04/2019.  History of Presenting Illness: The patient is a 53 year old, 2 pack a day smoker who reports a 1 to 2-week history of not feeling well that progressed to right-sided chest pain, productive cough with whitish-yellow sputum, shortness of breath, fever, night sweats, and generalized weakness.  He presented to [facility] where a CT scan of the chest showed a moderate to large loculated right pleural effusion consistent with empyema.  He was reportedly Covid-19 negative and had an elevated white blood cell count of 23,000.  He was transferred to [facility] care.  This 53 year old gentleman has a moderate sized loculated right pleural effusion with right lower lobe atelectasis/infiltrate consistent with probable right lung pneumonia and empyema.  He is afebrile but reportedly had a white blood cell count of 23,000 at [facility].  He has labs ordered for the morning.  He was reportedly Covid negative at [facility] but his Covid test is pending here.  This empyema will require surgical drainage by limited right thoracotomy to allow complete drainage and decortication of the lung.  He has been started on antibiotics. Dr. Laneta SimmersBartle discussed potential  risks, benefits, and complications of the surgery. Patient agreed to proceed with surgery. He underwent a muscle sparing right thoracotomy, drainage of empyema, and decortication on 10/04/2019.  Brief Hospital Course:  The patient remained afebrile and hemodynamically stable. A line and foley were removed early in the post operative course. He did have sinus bradycardia initially post op. He became more hypertensive so he was started on Amlodipine (unable to start Thiazide diuretic or ACE secondary to creatinine). Also, he developed worsening creatine. It went as high as 2.87. This was felt mostly secondary to pre op dehydration. His creatinine gradually improved as he was give IV fluids. Last creatinine was 2.10. Chest tube output gradually decreased and there was no air leak. Daily chest x rays were obtained and remained stable. Chest tube was removed on 11/30. Harrison MonsBlake drain was then removed on 12/02. He had persistent nausea and vomiting. Of note, he had nausea prior to admission. He also did not have much appetite but no abdominal pain. He had multiple bowel movements. He was put on Reglan and Ultram frequency was changed to PRN every 12 hours (secondary to elevated creatinine). GI consult was obtained on 12/03.  Pleural fluid culture showed rare STREPTOCOCCUS INTERMEDIUS. He was on Zosyn, which was switched to Unasyn (? Cause of nausea and emesis).  GI consult was obtained on 12/03. Patient is scheduled for EGD. Results showed esophagitis and he was started on a PPI. Patient is ambulating on room air. Patient is tolerating a diet and has had a bowel movement. Wounds are clean and dry. Final chest X ray showed no  pneumothorax and stable effusion. Patient is felt surgically stable for discharge today.   Latest Vital Signs: Blood pressure (!) 142/81, pulse (!) 47, temperature 98.6 F (37 C), temperature source Oral, resp. rate 19, height 5\' 11"  (1.803 m), weight 84.1 kg, SpO2 98 %.  Physical Exam: General  appearance: alert, cooperative and no distress Heart: regular rate and rhythm and brady Lungs: dim right lower than left base Abdomen: benign Extremities: no edema or calf tnderness Wound: well healed, staples intact Discharge Condition: Stable and discharged to home.  Recent laboratory studies:  Lab Results  Component Value Date   WBC 15.3 (H) 10/08/2019   HGB 13.7 10/08/2019   HCT 40.6 10/08/2019   MCV 87.5 10/08/2019   PLT 328 10/08/2019   Lab Results  Component Value Date   NA 141 10/12/2019   K 3.2 (L) 10/12/2019   CL 106 10/12/2019   CO2 26 10/12/2019   CREATININE 2.10 (H) 10/12/2019   GLUCOSE 99 10/12/2019      Diagnostic Studies: Dg Chest 2 View  Result Date: 10/10/2019 CLINICAL DATA:  Follow-up pneumothorax EXAM: CHEST - 2 VIEW COMPARISON:  Chest radiograph from one day prior. FINDINGS: Stable cardiomediastinal silhouette with normal heart size. Tiny right apical pneumothorax, slightly decreased. No left pneumothorax. Small right costophrenic angle pleural effusion is stable. Stable trace left pleural effusion. No pulmonary edema. Similar mild bibasilar patchy opacities, right greater than left. Skin staples overlie the lower lateral right chest wall. IMPRESSION: 1. Small right hydropneumothorax with decreased tiny right apical pneumothorax component. 2. Stable trace left pleural effusion. 3. Stable mild patchy bibasilar lung opacities, right greater than left, favor atelectasis. Electronically Signed   By: 14/01/2019 M.D.   On: 10/10/2019 09:14   Dg Chest Port 1 View  Result Date: 10/12/2019 CLINICAL DATA:  Pneumothorax. EXAM: PORTABLE CHEST 1 VIEW COMPARISON:  10/09/2019 FINDINGS: Removal of right-sided chest tube. No appreciable pneumothorax identified at this time. Moderate right pleural effusion is stable to increased in the interval. Left lung clear. IMPRESSION: 1. Removal of right chest tube without appreciable pneumothorax. 2. Stable to increased and moderate  right effusion. Electronically Signed   By: 14/12/2018 M.D.   On: 10/12/2019 11:11   Dg Chest Port 1 View  Result Date: 10/09/2019 CLINICAL DATA:  Follow-up pneumothorax. EXAM: PORTABLE CHEST 1 VIEW COMPARISON:  10/08/2019 FINDINGS: The right-sided chest tube is stable. Suspect small residual pneumothorax. Persistent right basilar atelectasis and small right pleural effusion. The left lung is relatively clear. Minimal left basilar atelectasis. IMPRESSION: 1. Stable right-sided chest tube with a very small residual pneumothorax. 2. Persistent small right pleural effusion and right basilar atelectasis. 3. Minimal residual left basilar atelectasis. Electronically Signed   By: 14/11/2018 M.D.   On: 10/09/2019 09:00   Dg Chest Port 1 View  Result Date: 10/08/2019 CLINICAL DATA:  Chest tube present. EXAM: PORTABLE CHEST 1 VIEW COMPARISON:  October 07, 2019. FINDINGS: Stable cardiomediastinal silhouette. Left lung is clear. One right-sided chest tube has been removed. The other remains unchanged in position. Mild right apical pneumothorax is noted. Minimal right pleural effusion is noted with right basilar atelectasis. Bony thorax is unremarkable. IMPRESSION: One right-sided chest tube has been removed. Mild right apical pneumothorax is noted. The remaining right-sided chest tube is unchanged in position. Minimal right pleural effusion is noted with right basilar atelectasis. No other significant changes are noted. Electronically Signed   By: October 09, 2019 M.D.   On: 10/08/2019 08:09  Dg Chest Port 1 View  Result Date: 10/07/2019 CLINICAL DATA:  Chest tube.  History of pneumothorax.  Sore chest. EXAM: PORTABLE CHEST 1 VIEW COMPARISON:  10/05/2019, 10/04/2019, 10/03/2019.  CT 10/01/2019. FINDINGS: Two right chest tubes in stable position. No pneumothorax. Heart size stable. Significant interval improved aeration right lung base. Mild residual bibasilar atelectasis/infiltrates. No significant pleural  effusion. No acute bony abnormality. IMPRESSION: 1.  Two right chest tubes in stable position.  No pneumothorax. 2. Significant improvement in aeration and right lung base. Mild residual bibasilar atelectasis/infiltrates. No significant pleural effusion. Electronically Signed   By: Maisie Fus  Register   On: 10/07/2019 06:04   Dg Chest Port 1 View  Result Date: 10/05/2019 CLINICAL DATA:  Status post right thoracotomy. EXAM: PORTABLE CHEST 1 VIEW COMPARISON:  10/04/2019 FINDINGS: Postoperative changes are again noted with unchanged positioning of 2 right-sided chest tubes. The cardiac silhouette is accentuated by portable AP technique and low lung volumes. There is worsening bibasilar lung aeration likely reflecting atelectasis and small pleural effusions. No pneumothorax is identified. IMPRESSION: 1. No pneumothorax. 2. Low lung volumes with increasing bibasilar atelectasis and small pleural effusions. Electronically Signed   By: Sebastian Ache M.D.   On: 10/05/2019 07:50   Dg Chest Port 1 View  Result Date: 10/04/2019 CLINICAL DATA:  Status post right thoracotomy EXAM: PORTABLE CHEST 1 VIEW COMPARISON:  10/03/2019 FINDINGS: Interval postoperative findings of right thoracotomy with near complete resolution of a previously seen loculated pleural effusion about the right lung base. Right-sided chest tubes are in position without significant pneumothorax appreciated. There is mild, diffuse interstitial opacity of the left lung, consistent with edema and/or partial atelectasis on low volume portable examination. The heart and mediastinum are unremarkable. IMPRESSION: Interval postoperative findings of right thoracotomy with near complete resolution of a previously seen loculated pleural effusion about the right lung base. Right-sided chest tubes are in position without significant pneumothorax appreciated. Electronically Signed   By: Lauralyn Primes M.D.   On: 10/04/2019 10:35   Dg Chest Port 1 View  Result Date:  10/03/2019 CLINICAL DATA:  Empyema. EXAM: PORTABLE CHEST 1 VIEW COMPARISON:  10/01/2019.  CT chest 10/01/2019 FINDINGS: Loculated pleural effusion in the right lung base unchanged. Right lower lobe consolidation unchanged. No pneumothorax Left lung clear.  Negative for heart failure. IMPRESSION: No interval change loculated right pleural effusion and right lower lobe airspace disease. Electronically Signed   By: Marlan Palau M.D.   On: 10/03/2019 09:41   Discharge Instructions    Discharge patient   Complete by: As directed    After receives po potassium dose   Discharge disposition: 01-Home or Self Care   Discharge patient date: 10/12/2019      Discharge Medications: Allergies as of 10/12/2019   No Known Allergies     Medication List    TAKE these medications   amLODipine 5 MG tablet Commonly known as: NORVASC Take 2 tablets (10 mg total) by mouth daily. Notes to patient: 10/13/19   amoxicillin-clavulanate 875-125 MG tablet Commonly known as: Augmentin Take 1 tablet by mouth 2 (two) times daily. Notes to patient: 10/12/19 Evening   naproxen sodium 220 MG tablet Commonly known as: ALEVE Take 440 mg by mouth daily as needed (pain).   pantoprazole 40 MG tablet Commonly known as: Protonix Take 1 tablet (40 mg total) by mouth 2 (two) times daily before a meal. Notes to patient: 10/12/19 Evening before a meal   traMADol 50 MG tablet Commonly known as: ULTRAM Take  1 tablet (50 mg total) by mouth 2 (two) times daily. Notes to patient: 10/12/19 Evening 9 pm       Follow Up Appointments: Follow-up Information    Gaye Pollack, MD. Go on 10/23/2019.   Specialty: Cardiothoracic Surgery Why: PA/LAT CXR to be taken (at [facility] which is in the same building as Dr. Vivi Martens office) on 12/16 at 11:30 am;Appointment time is at 12:00 pm Contact information: [place] [id number] [phone]           Signed: John Giovanni  PA-C 10/15/2019, 12:52 PM

## 2019-10-07 NOTE — Discharge Instructions (Signed)
Discharge Instructions:  1. You may shower, please wash incisions daily with soap and water and keep dry.  If you wish to cover wounds with dressing you may do so but please keep clean and change daily.  No tub baths or swimming until incisions have completely healed.  If your incisions become red or develop any drainage please call our office at [phone]  2. No Driving until cleared by Dr. Bartle's office and you are no longer using narcotic pain medications  3. Fever of 101.5 for at least 24 hours with no source, please contact our office at [phone]  4. Activity- up as tolerated, please walk at least 3 times per day.  Avoid strenuous activity, no lifting, pushing, or pulling with your arms over 8-10 lbs for a minimum of 6 weeks  5. If any questions or concerns arise, please do not hesitate to contact our office at [phone] 

## 2019-10-07 NOTE — Progress Notes (Signed)
3 Days Post-Op Procedure(s) (LRB): THORACOTOMY (Right) EMPYEMA DRAINAGE (Right) Subjective: Some nausea after morphine but otherwise feels fine. Pain under control.  Objective: Vital signs in last 24 hours: Temp:  [97.6 F (36.4 C)-98.4 F (36.9 C)] 98.1 F (36.7 C) (11/30 0414) Pulse Rate:  [48-66] 48 (11/30 0414) Cardiac Rhythm: Normal sinus rhythm (11/30 0700) Resp:  [15-22] 19 (11/30 0414) BP: (110-142)/(60-76) 125/74 (11/30 0414) SpO2:  [93 %-98 %] 96 % (11/30 0414) Weight:  [82.6 kg] 82.6 kg (11/30 0024)  Hemodynamic parameters for last 24 hours:    Intake/Output from previous day: 11/29 0701 - 11/30 0700 In: 2113.7 [P.O.:1000; I.V.:967.3; IV Piggyback:146.4] Out: 2670 [Urine:2450; Chest Tube:220] Intake/Output this shift: No intake/output data recorded.  General appearance: alert and cooperative Neurologic: intact Heart: regular rate and rhythm, S1, S2 normal, no murmur, click, rub or gallop Lungs: rhonchi RLL chest tube output low, serous, no air leak  Lab Results: Recent Labs    10/06/19 0217 10/07/19 0223  WBC 14.7* 14.1*  HGB 13.1 13.9  HCT 40.7 43.1  PLT 366 366   BMET:  Recent Labs    10/06/19 0217 10/07/19 0223  NA 140 141  K 3.7 3.7  CL 105 106  CO2 24 22  GLUCOSE 99 102*  BUN 26* 26*  CREATININE 2.83* 2.95*  CALCIUM 8.3* 8.3*    PT/INR: No results for input(s): LABPROT, INR in the last 72 hours. ABG    Component Value Date/Time   PHART 7.349 (L) 10/05/2019 0502   HCO3 22.2 10/05/2019 0502   TCO2 23 10/05/2019 0502   ACIDBASEDEF 3.0 (H) 10/05/2019 0502   O2SAT 97.0 10/05/2019 0502   CBG (last 3)  Recent Labs    10/06/19 1958 10/07/19 0020 10/07/19 0417  GLUCAP 116* 85 96   CXR: looks good. Postop changes right base.  Assessment/Plan: S/P Procedure(s) (LRB): THORACOTOMY (Right) EMPYEMA DRAINAGE (Right)  POD 3  Hemodynamically stable in sinus rhythm.  Afebrile with WBC decreasing.  DC chest tube and keep Blake  drain in today, plan to remove it tomorrow.  Continue Zosyn. OR cultures negative.  Acute nonoliguric renal failure due to preop dehydration, infection. Creat fairly stable and urine output good. Will continue IVF at 50 cc/hr. Taking po ok. Expect this to improve over next few days.  IS, ambulation.   LOS: 5 days    Nathan [name] 10/07/2019

## 2019-10-07 NOTE — Progress Notes (Signed)
Chest tube removed without difficulty.  Suture securely tied and dressing applied.  Patient tolerated well. No SOB after tube removal.  Drain attached to water seal.  Dr Cyndia Bent suggested drain my be removed tomorrow morning.

## 2019-10-08 ENCOUNTER — Inpatient Hospital Stay (HOSPITAL_COMMUNITY): Payer: Medicaid Other

## 2019-10-08 LAB — CBC
HCT: 40.6 % (ref 39.0–52.0)
Hemoglobin: 13.7 g/dL (ref 13.0–17.0)
MCH: 29.5 pg (ref 26.0–34.0)
MCHC: 33.7 g/dL (ref 30.0–36.0)
MCV: 87.5 fL (ref 80.0–100.0)
Platelets: 328 10*3/uL (ref 150–400)
RBC: 4.64 MIL/uL (ref 4.22–5.81)
RDW: 12.9 % (ref 11.5–15.5)
WBC: 15.3 10*3/uL — ABNORMAL HIGH (ref 4.0–10.5)
nRBC: 0 % (ref 0.0–0.2)

## 2019-10-08 LAB — BASIC METABOLIC PANEL
Anion gap: 11 (ref 5–15)
BUN: 22 mg/dL — ABNORMAL HIGH (ref 6–20)
CO2: 22 mmol/L (ref 22–32)
Calcium: 8.2 mg/dL — ABNORMAL LOW (ref 8.9–10.3)
Chloride: 106 mmol/L (ref 98–111)
Creatinine, Ser: 2.62 mg/dL — ABNORMAL HIGH (ref 0.61–1.24)
GFR calc Af Amer: 31 mL/min — ABNORMAL LOW (ref >60)
GFR calc non Af Amer: 27 mL/min — ABNORMAL LOW (ref >60)
Glucose, Bld: 97 mg/dL (ref 70–99)
Potassium: 4.3 mmol/L (ref 3.5–5.1)
Sodium: 139 mmol/L (ref 135–145)

## 2019-10-08 LAB — CULTURE, BLOOD (ROUTINE X 2)
Culture: NO GROWTH
Culture: NO GROWTH
Special Requests: ADEQUATE
Special Requests: ADEQUATE

## 2019-10-08 MED ORDER — SODIUM CHLORIDE 0.9 % IV SOLN
3.0000 g | Freq: Four times a day (QID) | INTRAVENOUS | Status: DC
  Administered 2019-10-08 – 2019-10-12 (×14): 3 g via INTRAVENOUS
  Filled 2019-10-08: qty 3
  Filled 2019-10-08 (×2): qty 8
  Filled 2019-10-08 (×3): qty 3
  Filled 2019-10-08: qty 8
  Filled 2019-10-08 (×10): qty 3
  Filled 2019-10-08: qty 8

## 2019-10-08 MED ORDER — METOCLOPRAMIDE HCL 5 MG/ML IJ SOLN
10.0000 mg | Freq: Four times a day (QID) | INTRAMUSCULAR | Status: AC
  Administered 2019-10-08 – 2019-10-10 (×8): 10 mg via INTRAVENOUS
  Filled 2019-10-08 (×7): qty 2

## 2019-10-08 MED ORDER — POTASSIUM CHLORIDE CRYS ER 10 MEQ PO TBCR
10.0000 meq | EXTENDED_RELEASE_TABLET | Freq: Once | ORAL | Status: AC
  Administered 2019-10-08: 10 meq via ORAL
  Filled 2019-10-08: qty 1

## 2019-10-08 MED ORDER — TRAMADOL HCL 50 MG PO TABS
50.0000 mg | ORAL_TABLET | Freq: Two times a day (BID) | ORAL | Status: DC | PRN
  Administered 2019-10-10 – 2019-10-12 (×3): 50 mg via ORAL
  Filled 2019-10-08 (×4): qty 1

## 2019-10-08 NOTE — Progress Notes (Addendum)
      [address]       [place] [address]             [phone]       4 Days Post-Op Procedure(s) (LRB): THORACOTOMY (Right) EMPYEMA DRAINAGE (Right)  Subjective: Patient vomited this am. He states about the only thing he can eat is grits. He thinks it is either the Tylenol he took this am or antibiotic, although this is given IV.  Objective: Vital signs in last 24 hours: Temp:  [97.6 F (36.4 C)-98.2 F (36.8 C)] 97.7 F (36.5 C) (12/01 0324) Pulse Rate:  [57-62] 57 (11/30 1949) Cardiac Rhythm: Sinus bradycardia (11/30 2119) Resp:  [19-20] 19 (12/01 0005) BP: (108-162)/(75-92) 162/92 (12/01 0324) SpO2:  [95 %-96 %] 95 % (12/01 0005) Weight:  [83.1 kg] 83.1 kg (12/01 0625)     Intake/Output from previous day: 11/30 0701 - 12/01 0700 In: 723 [P.O.:720; I.V.:3] Out: 1270 [Urine:1225; Chest Tube:45]   Physical Exam:  Cardiovascular: Bradycardic Pulmonary: Clear to auscultation on left and coarse on the right Abdomen: Soft, non tender, bowel sounds present. Extremities: No lower extremity edema. Wounds: Clean and dry.  No erythema or signs of infection. Chest Tube: to water seal, no air leak  Lab Results: CBC: Recent Labs    10/07/19 0223 10/08/19 0258  WBC 14.1* 15.3*  HGB 13.9 13.7  HCT 43.1 40.6  PLT 366 328   BMET:  Recent Labs    10/07/19 0223 10/08/19 0258  NA 141 139  K 3.7 4.3  CL 106 106  CO2 22 22  GLUCOSE 102* 97  BUN 26* 22*  CREATININE 2.95* 2.62*  CALCIUM 8.3* 8.2*    PT/INR: No results for input(s): LABPROT, INR in the last 72 hours. ABG:  INR: Will add last result for INR, ABG once components are confirmed Will add last 4 CBG results once components are confirmed  Assessment/Plan:  1. CV - SB with HR in the 40's. 2.  Pulmonary - On room air. Blake drain with 45 cc of output last 24 hours. CXR this am shows small right apical pneumothorax. Likely remove Blake drain. Check CXR in am. Encourage incentive  spirometer. 3. ID-on Zosyn. Culture showed RARE STREPTOCOCCUS INTERMEDIUS. Await antibiotic sensitivities.  4. Acute nonoliguric renal failure due to preop dehydration, infection. Creatinine this am decreased to 2.62. On IVF at 50 cc/hr. Re check in am 5.Gently supplement potassium 6. GI-intermittent nausea, vomiting. He has no abdominal pain. Only on Tylenol and Ultram PRN. May need to make NPO if continues.   Donielle M ZimmermanPA-C 10/08/2019,8:03 AM [phone]   Chart reviewed, patient examined, agree with above. He has tiny apical ptx on CXR probably from removing the tube yesterday. There is no air leak. Plan to remove the remaining tube in the am.  Will add Reglan for nausea. Etiology unclear but suspect this is medication related.

## 2019-10-08 NOTE — Progress Notes (Addendum)
At 1150 pt vomited small amount yellow emesis. Denies nausea, pt vague with answers when questions asked to assess how is feeling now and before vomiting. Pt given IV zofran at 1155. Pt has been eating ice chips he states and pt was given 1 coated 63mEq Kdur tablet at 1006 this morning,  pill was not in emesis. Pt has bowel sounds, denies passing gas, had BM last pm and early am 0330 on night shift.  Will monitor.    Pt's family member (sister) at bedside.

## 2019-10-08 NOTE — Progress Notes (Signed)
Pharmacy Antibiotic Note  Nathan [name] is a 53 y.o. male admitted on 10/02/2019 as a transfer from [facility] after presentation with right-sided CP, cough and SOB.  Chest CT at [facility] showed mod-large loculated right-sided pleural effusion and was concerning for RLL pneumonia. Pharmacy has been consulted for Zosyn and vancomycin dosing.   Pt now s/p thoracotomy on 11/27. Cr has risen 1>2.9> improving 2.6 today CrCl 37ml/min vancomycin stopped 11/27 Continue Zosyn EI    Plan: -Continue Zosyn 3.375g IV EI q8h -Monitor cultures and kidney function   Height: 5\' 11"  (180.3 cm) Weight: 183 lb 3.2 oz (83.1 kg) IBW/kg (Calculated) : 75.3  Temp (24hrs), Avg:98 F (36.7 C), Min:97.6 F (36.4 C), Max:98.3 F (36.8 C)   No Known Allergies  Microbiology results: 11/27 empyema tissue: streptococcus intermedius - Sensitivities pending   11/24 [facility]): ngF - called [facility] 11/30 11/26 [facility] x 2: ngtd 11/25 COVID neg  Bonnita Nasuti Pharm.D. CPP, BCPS Clinical Pharmacist [phone] 10/08/2019 12:25 PM    Please check AMION for all [place] numbers 10/08/2019

## 2019-10-09 ENCOUNTER — Inpatient Hospital Stay (HOSPITAL_COMMUNITY): Payer: Medicaid Other

## 2019-10-09 LAB — BASIC METABOLIC PANEL
Anion gap: 9 (ref 5–15)
BUN: 17 mg/dL (ref 6–20)
CO2: 26 mmol/L (ref 22–32)
Calcium: 8.4 mg/dL — ABNORMAL LOW (ref 8.9–10.3)
Chloride: 107 mmol/L (ref 98–111)
Creatinine, Ser: 2.43 mg/dL — ABNORMAL HIGH (ref 0.61–1.24)
GFR calc Af Amer: 34 mL/min — ABNORMAL LOW (ref >60)
GFR calc non Af Amer: 29 mL/min — ABNORMAL LOW (ref >60)
Glucose, Bld: 118 mg/dL — ABNORMAL HIGH (ref 70–99)
Potassium: 3.7 mmol/L (ref 3.5–5.1)
Sodium: 142 mmol/L (ref 135–145)

## 2019-10-09 LAB — AEROBIC/ANAEROBIC CULTURE W GRAM STAIN (SURGICAL/DEEP WOUND): Culture: NO GROWTH

## 2019-10-09 MED ORDER — AMLODIPINE BESYLATE 5 MG PO TABS
5.0000 mg | ORAL_TABLET | Freq: Every day | ORAL | Status: DC
  Administered 2019-10-11 – 2019-10-12 (×2): 5 mg via ORAL
  Filled 2019-10-09 (×5): qty 1

## 2019-10-09 NOTE — Plan of Care (Signed)
  Problem: Education: Goal: Knowledge of General Education information will improve Description: Including pain rating scale, medication(s)/side effects and non-pharmacologic comfort measures Outcome: Progressing   Problem: Health Behavior/Discharge Planning: Goal: Ability to manage health-related needs will improve Outcome: Progressing   Problem: Clinical Measurements: Goal: Ability to maintain clinical measurements within normal limits will improve Outcome: Progressing Goal: Will remain free from infection Outcome: Progressing Goal: Diagnostic test results will improve Outcome: Progressing Goal: Respiratory complications will improve Outcome: Progressing Goal: Cardiovascular complication will be avoided Outcome: Progressing   Problem: Activity: Goal: Risk for activity intolerance will decrease Outcome: Progressing   Problem: Nutrition: Goal: Adequate nutrition will be maintained Outcome: Progressing   Problem: Coping: Goal: Level of anxiety will decrease Outcome: Progressing   Problem: Elimination: Goal: Will not experience complications related to bowel motility Outcome: Progressing Goal: Will not experience complications related to urinary retention Outcome: Progressing   Problem: Pain Managment: Goal: General experience of comfort will improve Outcome: Progressing   Problem: Education: Goal: Knowledge of disease or condition will improve Outcome: Progressing Goal: Knowledge of the prescribed therapeutic regimen will improve Outcome: Progressing   Problem: Activity: Goal: Risk for activity intolerance will decrease Outcome: Progressing   Problem: Cardiac: Goal: Will achieve and/or maintain hemodynamic stability Outcome: Progressing   Problem: Clinical Measurements: Goal: Postoperative complications will be avoided or minimized Outcome: Progressing   Problem: Respiratory: Goal: Respiratory status will improve Outcome: Progressing   Problem: Pain  Management: Goal: Pain level will decrease Outcome: Progressing   Problem: Skin Integrity: Goal: Wound healing without signs and symptoms infection will improve Outcome: Progressing

## 2019-10-09 NOTE — Progress Notes (Addendum)
      [address]       [place] [address]             [phone]       5 Days Post-Op Procedure(s) (LRB): THORACOTOMY (Right) EMPYEMA DRAINAGE (Right)  Subjective: Patient vomited a lot last night. He denies abdominal pain but has no appetite.  Objective: Vital signs in last 24 hours: Temp:  [97.4 F (36.3 C)-98.3 F (36.8 C)] 97.9 F (36.6 C) (12/02 0429) Pulse Rate:  [44-61] 61 (12/02 0429) Cardiac Rhythm: Sinus bradycardia (12/02 0340) Resp:  [16-24] 22 (12/02 0429) BP: (132-161)/(70-83) 161/83 (12/02 0429) SpO2:  [96 %-100 %] 97 % (12/02 0429) Weight:  [82.3 kg] 82.3 kg (12/02 0429)     Intake/Output from previous day: 12/01 0701 - 12/02 0700 In: 2822.8 [P.O.:240; I.V.:2282.8; IV Piggyback:300] Out: 940 [Urine:800; Emesis/NG output:100; Chest Tube:40]   Physical Exam:  Cardiovascular: Bradycardic Pulmonary: Somewhat coarse this am Abdomen: Soft, non tender, bowel sounds present. Extremities: No lower extremity edema. Wounds: Clean and dry.  No erythema or signs of infection. Chest Tube: to water seal, no air leak  Lab Results: CBC: Recent Labs    10/07/19 0223 10/08/19 0258  WBC 14.1* 15.3*  HGB 13.9 13.7  HCT 43.1 40.6  PLT 366 328   BMET:  Recent Labs    10/08/19 0258 10/09/19 0223  NA 139 142  K 4.3 3.7  CL 106 107  CO2 22 26  GLUCOSE 97 118*  BUN 22* 17  CREATININE 2.62* 2.43*  CALCIUM 8.2* 8.4*    PT/INR: No results for input(s): LABPROT, INR in the last 72 hours. ABG:  INR: Will add last result for INR, ABG once components are confirmed Will add last 4 CBG results once components are confirmed  Assessment/Plan:  1. CV - SB with HR in the 50's at times. He is more hypertensive. Thiazide diuretic or ACE unable to be used secondary to elevated creatinine. No BB secondary to bradycardia. Will start Amlodipine. 2.  Pulmonary - On room air. Blake drain with 40 cc of output last 24 hours. CXR this am shows small,  stable right apical pneumothorax. Will remove Blake drain. Check CXR in am. Encourage incentive spirometer. 3. ID-on Unasyn Culture showed RARE STREPTOCOCCUS INTERMEDIUS.  4. Acute nonoliguric renal failure due to preop dehydration, infection. Creatinine this am decreased to 2.43. On IVF at 50 cc/hr. Re check in am 5. GI-persistent nausea, vomiting. Possibly secondary from medications. Zosyn was changed to Unasyn yesterday and he was given Reglan. On Ultram PRN at less frequency secondary to elevated creatinine. He has had bowel movements.  Donielle M ZimmermanPA-C 10/09/2019,7:42 AM [phone]    Chart reviewed, patient examined, agree with above. Blake drain removed. Unclear why he has nausea but most likely medication or stress related. He says he was having some before he came to the hospital.

## 2019-10-09 NOTE — Progress Notes (Signed)
Blake drain removed without difficulty.  Patient tolerated well.  Sutures tightened and tied and gauze dressing applied.  Patient in stable condition, Dr Cyndia Bent present during drain removal

## 2019-10-10 ENCOUNTER — Inpatient Hospital Stay (HOSPITAL_COMMUNITY): Payer: Medicaid Other

## 2019-10-10 LAB — BASIC METABOLIC PANEL
Anion gap: 10 (ref 5–15)
BUN: 13 mg/dL (ref 6–20)
CO2: 27 mmol/L (ref 22–32)
Calcium: 8.3 mg/dL — ABNORMAL LOW (ref 8.9–10.3)
Chloride: 106 mmol/L (ref 98–111)
Creatinine, Ser: 2.06 mg/dL — ABNORMAL HIGH (ref 0.61–1.24)
GFR calc Af Amer: 41 mL/min — ABNORMAL LOW (ref >60)
GFR calc non Af Amer: 36 mL/min — ABNORMAL LOW (ref >60)
Glucose, Bld: 118 mg/dL — ABNORMAL HIGH (ref 70–99)
Potassium: 3.3 mmol/L — ABNORMAL LOW (ref 3.5–5.1)
Sodium: 143 mmol/L (ref 135–145)

## 2019-10-10 MED ORDER — SODIUM CHLORIDE 0.9 % IV SOLN: INTRAVENOUS | Status: DC
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
## 2019-10-10 NOTE — Progress Notes (Addendum)
      [address]       [place], [address]             [phone]       6 Days Post-Op Procedure(s) (LRB): THORACOTOMY (Right) EMPYEMA DRAINAGE (Right)  Subjective: Patient still nausea and some emesis. Of note, he did have nausea prior to admission.  Objective: Vital signs in last 24 hours: Temp:  [97.6 F (36.4 C)-98.2 F (36.8 C)] 97.8 F (36.6 C) (12/03 0409) Pulse Rate:  [43-45] 45 (12/03 0409) Cardiac Rhythm: Sinus bradycardia (12/02 1900) Resp:  [14-23] 20 (12/03 0409) BP: (137-168)/(70-79) 137/76 (12/03 0409) SpO2:  [98 %] 98 % (12/02 2014) Weight:  [81.8 kg] 81.8 kg (12/03 0409)     Intake/Output from previous day: 12/02 0701 - 12/03 0700 In: 3 [I.V.:3] Out: 326 [Urine:325; Stool:1]   Physical Exam:  Cardiovascular: Bradycardic Pulmonary: Somewhat coarse this am Abdomen: Soft, non tender, bowel sounds present. Extremities: No lower extremity edema. Wounds: Clean and dry.  No erythema or signs of infection.   Lab Results: CBC: Recent Labs    10/08/19 0258  WBC 15.3*  HGB 13.7  HCT 40.6  PLT 328   BMET:  Recent Labs    10/09/19 0223 10/10/19 0249  NA 142 143  K 3.7 3.3*  CL 107 106  CO2 26 27  GLUCOSE 118* 118*  BUN 17 13  CREATININE 2.43* 2.06*  CALCIUM 8.4* 8.3*    PT/INR: No results for input(s): LABPROT, INR in the last 72 hours. ABG:  INR: Will add last result for INR, ABG once components are confirmed Will add last 4 CBG results once components are confirmed  Assessment/Plan:  1. CV - SB with HR in the 40-50's at times. BP better controlled with Amlodipine. 2.  Pulmonary - On room air. CXR this am shows small, stable right apical pneumothorax. Await this am's CXR. 3. ID-on Unasyn Culture showed RARE STREPTOCOCCUS INTERMEDIUS. Will transition to oral Keflex, Amoxicillin, or Augmentin at discharge 4. Acute nonoliguric renal failure due to preop dehydration, infection. Creatinine this am decreased to 2.06.  On IVF at 50 cc/hr. Re check in am 5. GI-fairly persistent nausea, vomiting. Possibly secondary from medications, stress?  On Ultram PRN at less frequency secondary to elevated creatinine. He has had no abdominal pain and has had bowel movements. 6. Await CXR this am. Patient wishes to go home. Will discuss with Dr. Cyndia Bent as creatinine still elevated, although decreasing.  Donielle M ZimmermanPA-C 10/10/2019,7:01 AM [phone]    Chart reviewed, patient examined, agree with above. He still has no appetite, nausea and vomiting. Did not eat anything yesterday according to nurse. He can't go home until he can take po reliably or he will be right back in worsening renal failure. Will consult GI. He could have an ulcer or gastritis.

## 2019-10-11 ENCOUNTER — Encounter (HOSPITAL_COMMUNITY): Payer: Self-pay | Admitting: Certified Registered Nurse Anesthetist

## 2019-10-11 ENCOUNTER — Encounter (HOSPITAL_COMMUNITY)
Admission: AD | Disposition: A | Discharge status: Home / Self Care | Payer: Self-pay | Source: Other Acute Inpatient Hospital | Attending: Surgery

## 2019-10-11 ENCOUNTER — Inpatient Hospital Stay (HOSPITAL_COMMUNITY): Payer: Medicaid Other | Admitting: Certified Registered Nurse Anesthetist

## 2019-10-11 HISTORY — PX: ESOPHAGOGASTRODUODENOSCOPY (EGD) WITH PROPOFOL: SHX5813

## 2019-10-11 HISTORY — PX: BIOPSY: SHX5522

## 2019-10-11 LAB — BASIC METABOLIC PANEL
Anion gap: 10 (ref 5–15)
BUN: 13 mg/dL (ref 6–20)
CO2: 24 mmol/L (ref 22–32)
Calcium: 8 mg/dL — ABNORMAL LOW (ref 8.9–10.3)
Chloride: 105 mmol/L (ref 98–111)
Creatinine, Ser: 2 mg/dL — ABNORMAL HIGH (ref 0.61–1.24)
GFR calc Af Amer: 43 mL/min — ABNORMAL LOW (ref >60)
GFR calc non Af Amer: 37 mL/min — ABNORMAL LOW (ref >60)
Glucose, Bld: 109 mg/dL — ABNORMAL HIGH (ref 70–99)
Potassium: 3 mmol/L — ABNORMAL LOW (ref 3.5–5.1)
Sodium: 139 mmol/L (ref 135–145)

## 2019-10-11 SURGERY — ESOPHAGOGASTRODUODENOSCOPY (EGD) WITH PROPOFOL
Anesthesia: Monitor Anesthesia Care

## 2019-10-11 MED ORDER — DOCUSATE SODIUM 100 MG PO CAPS
200.0000 mg | ORAL_CAPSULE | Freq: Every day | ORAL | Status: DC
  Administered 2019-10-12: 200 mg via ORAL
  Filled 2019-10-11: qty 2

## 2019-10-11 MED ORDER — LACTATED RINGERS IV SOLN
INTRAVENOUS | Status: DC
  Administered 2019-10-11: 1000 mL via INTRAVENOUS

## 2019-10-11 MED ORDER — KCL IN DEXTROSE-NACL 20-5-0.9 MEQ/L-%-% IV SOLN
INTRAVENOUS | Status: DC
  Administered 2019-10-11: 09:00:00 via INTRAVENOUS
  Filled 2019-10-11 (×2): qty 1000

## 2019-10-11 MED ORDER — PROPOFOL 500 MG/50ML IV EMUL
INTRAVENOUS | Status: DC | PRN
  Administered 2019-10-11: 150 ug/kg/min via INTRAVENOUS

## 2019-10-11 MED ORDER — PROPOFOL 10 MG/ML IV BOLUS
INTRAVENOUS | Status: DC | PRN
  Administered 2019-10-11: 40 mg via INTRAVENOUS

## 2019-10-11 MED ORDER — PANTOPRAZOLE SODIUM 40 MG IV SOLR
40.0000 mg | Freq: Two times a day (BID) | INTRAVENOUS | Status: DC
  Administered 2019-10-11 – 2019-10-12 (×2): 40 mg via INTRAVENOUS
  Filled 2019-10-11 (×2): qty 40

## 2019-10-11 MED ORDER — LIDOCAINE 2% (20 MG/ML) 5 ML SYRINGE
INTRAMUSCULAR | Status: DC | PRN
  Administered 2019-10-11: 60 mg via INTRAVENOUS

## 2019-10-11 MED ORDER — POTASSIUM CHLORIDE CRYS ER 20 MEQ PO TBCR
40.0000 meq | EXTENDED_RELEASE_TABLET | Freq: Once | ORAL | Status: AC
  Administered 2019-10-11: 40 meq via ORAL
  Filled 2019-10-11: qty 2

## 2019-10-11 MED ORDER — ONDANSETRON HCL 4 MG/2ML IJ SOLN
INTRAMUSCULAR | Status: DC | PRN
  Administered 2019-10-11: 4 mg via INTRAVENOUS

## 2019-10-11 SURGICAL SUPPLY — 15 items

## 2019-10-11 NOTE — Op Note (Signed)
[facility] Patient Name: Nathan [name] Procedure Date : 10/11/2019 MRN: [id number] Attending MD: Willis Modena , MD Date of Birth: 1966-02-02 CSN: [id number] Age: 53 Admit Type: Inpatient Procedure:                Upper GI endoscopy Indications:              Nausea with vomiting Providers:                Willis Modena, MD, Zoe Lan, RN, Matthew Folks, Technician Referring MD:             Dr. Laneta Simmers (thoracic surgery) Medicines:                Monitored Anesthesia Care Complications:            No immediate complications. Estimated Blood Loss:     Estimated blood loss: none. Procedure:                Pre-Anesthesia Assessment:                           - Prior to the procedure, a History and Physical                            was performed, and patient medications and                            allergies were reviewed. The patient's tolerance of                            previous anesthesia was also reviewed. The risks                            and benefits of the procedure and the sedation                            options and risks were discussed with the patient.                            All questions were answered, and informed consent                            was obtained. Prior Anticoagulants: The patient has                            taken no previous anticoagulant or antiplatelet                            agents. ASA Grade Assessment: III - A patient with                            severe systemic disease. After reviewing the risks  and benefits, the patient was deemed in                            satisfactory condition to undergo the procedure.                           After obtaining informed consent, the endoscope was                            passed under direct vision. Throughout the                            procedure, the patient's blood pressure, pulse, and      oxygen saturations were monitored continuously. The                            GIF-H190 ([id number]) Olympus gastroscope was                            introduced through the mouth, and advanced to the                            second part of duodenum. The upper GI endoscopy was                            accomplished without difficulty. The patient                            tolerated the procedure well. Scope In: Scope Out: Findings:      LA Grade A (one or more mucosal breaks less than 5 mm, not extending       between tops of 2 mucosal folds) esophagitis was found.      The exam of the esophagus was otherwise normal.      The entire examined stomach was normal.      Patchy mildly congested mucosa without active bleeding and with no       stigmata of bleeding was found in the duodenal bulb. This was biopsied       with a cold forceps for histology.      The exam of the duodenum was otherwise normal. Impression:               - LA Grade A esophagitis.                           - Normal stomach.                           - Congested duodenal mucosa. Biopsied.                           - Nausea and vomiting likely multifactorial:                            Post-operative effect, narcotics, esophagitis. Moderate Sedation:      None Recommendation:           -  Return patient to referring hospital for ongoing                            care.                           - Clear liquid diet today.                           - Continue present medications. Add PPI.                           - Await pathology results.                           [name]- [facility] will follow. Procedure Code(s):        --- Professional ---                           [phone], Esophagogastroduodenoscopy, flexible,                            transoral; with biopsy, single or multiple Diagnosis Code(s):        --- Professional ---                           K20.90, Esophagitis, unspecified without bleeding                            K31.89, Other diseases of stomach and duodenum                           R11.2, Nausea with vomiting, unspecified CPT copyright 2019 American Medical Association. All rights reserved. The codes documented in this report are preliminary and upon coder review may  be revised to meet current compliance requirements. Willis ModenaWilliam Mickey Esguerra, MD 10/11/2019 2:27:50 PM This report has been signed electronically. Number of Addenda: 0

## 2019-10-11 NOTE — Anesthesia Preprocedure Evaluation (Signed)
Anesthesia Evaluation  Patient identified by MRN, date of birth, ID band Patient awake    Reviewed: Allergy & Precautions, NPO status , Patient's Chart, lab work & pertinent test results  Airway Mallampati: II  TM Distance: >3 FB Neck ROM: Full    Dental no notable dental hx.    Pulmonary Current Smoker,  S/P VATS decortication 6 days ago   Pulmonary exam normal breath sounds clear to auscultation       Cardiovascular negative cardio ROS Normal cardiovascular exam Rhythm:Regular Rate:Normal     Neuro/Psych negative neurological ROS  negative psych ROS   GI/Hepatic negative GI ROS, Neg liver ROS,   Endo/Other  negative endocrine ROS  Renal/GU Renal InsufficiencyRenal disease  negative genitourinary   Musculoskeletal negative musculoskeletal ROS (+)   Abdominal   Peds negative pediatric ROS (+)  Hematology negative hematology ROS (+)   Anesthesia Other Findings   Reproductive/Obstetrics negative OB ROS                             Anesthesia Physical Anesthesia Plan  ASA: III  Anesthesia Plan: MAC   Post-op Pain Management:    Induction: Intravenous  PONV Risk Score and Plan:   Airway Management Planned: Simple Face Mask  Additional Equipment:   Intra-op Plan:   Post-operative Plan:   Informed Consent: I have reviewed the patients History and Physical, chart, labs and discussed the procedure including the risks, benefits and alternatives for the proposed anesthesia with the patient or authorized representative who has indicated his/her understanding and acceptance.     Dental advisory given  Plan Discussed with: CRNA and Surgeon  Anesthesia Plan Comments:         Anesthesia Quick Evaluation

## 2019-10-11 NOTE — Progress Notes (Signed)
MD made aware of pts potassium lab this am. New order received by verbal order with read back. Will continue to monitor.

## 2019-10-11 NOTE — Progress Notes (Addendum)
      [address]       [place] [address]             [phone]       7 Days Post-Op Procedure(s) (LRB): THORACOTOMY (Right) EMPYEMA DRAINAGE (Right)  Subjective: Patient just had a "spell"-nausea, dry heaves  Objective: Vital signs in last 24 hours: Temp:  [97.5 F (36.4 C)-98.3 F (36.8 C)] 97.8 F (36.6 C) (12/04 0337) Pulse Rate:  [46-56] 56 (12/04 0337) Cardiac Rhythm: Sinus bradycardia (12/04 0642) Resp:  [11-20] 19 (12/04 0550) BP: (144-161)/(75-84) 158/79 (12/04 0337) SpO2:  [98 %-100 %] 98 % (12/04 0337) Weight:  [83.2 kg] 83.2 kg (12/04 0500)     Intake/Output from previous day: 12/03 0701 - 12/04 0700 In: 2732.5 [P.O.:300; I.V.:2232.5; IV Piggyback:200] Out: 1175 [URKYH:0623]   Physical Exam:  Cardiovascular: Bradycardic Pulmonary: Somewhat coarse on left this am Abdomen: Soft, non tender, bowel sounds present. Extremities: No lower extremity edema. Wounds: Clean and dry.  No erythema or signs of infection.   Lab Results: CBC: No results for input(s): WBC, HGB, HCT, PLT in the last 72 hours. BMET:  Recent Labs    10/10/19 0249 10/11/19 0244  NA 143 139  K 3.3* 3.0*  CL 106 105  CO2 27 24  GLUCOSE 118* 109*  BUN 13 13  CREATININE 2.06* 2.00*  CALCIUM 8.3* 8.0*    PT/INR: No results for input(s): LABPROT, INR in the last 72 hours. ABG:  INR: Will add last result for INR, ABG once components are confirmed Will add last 4 CBG results once components are confirmed  Assessment/Plan:  1. CV - SB . On Amlodipine for hypertension post op. 2.  Pulmonary - On room air. Check CXR in am. 3. ID-on Unasyn Culture showed RARE STREPTOCOCCUS INTERMEDIUS. Will transition to oral Keflex, Amoxicillin, or Augmentin at discharge 4. Acute nonoliguric renal failure due to preop dehydration, infection. Creatinine this am slightly decreased to 2.0. On IVF at 50 cc/hr. Re check in am 5. GI-fairly persistent nausea, vomiting. Appreciate  GI's assistance. Scheduled for EGD 6. Will change IVF to have potassium as hypokalemic 7. At patient request, start stool softeners  Donielle M ZimmermanPA-C 10/11/2019,7:02 AM [phone]     Chart reviewed, patient examined, agree with above. Renal function continues to improve. Replete K+. GI planning EGD today.

## 2019-10-11 NOTE — Transfer of Care (Signed)
Immediate Anesthesia Transfer of Care Note  Patient: Stein Windhorst III  Procedure(s) Performed: ESOPHAGOGASTRODUODENOSCOPY (EGD) WITH PROPOFOL (N/A ) BIOPSY  Patient Location: Endoscopy Unit  Anesthesia Type:MAC  Level of Consciousness: awake, alert  and oriented  Airway & Oxygen Therapy: Patient Spontanous Breathing and Patient connected to face mask oxygen  Post-op Assessment: Report given to RN and Post -op Vital signs reviewed and stable  Post vital signs: Reviewed and stable  Last Vitals:  Vitals Value Taken Time  BP 133/72 10/11/19 1424  Temp 36.5 C 10/11/19 1424  Pulse 46 10/11/19 1428  Resp 14 10/11/19 1428  SpO2 99 % 10/11/19 1428  Vitals shown include unvalidated device data.  Last Pain:  Vitals:   10/11/19 1424  TempSrc: Oral  PainSc: 0-No pain      Patients Stated Pain Goal: 0 (17/61/60 7371)  Complications: No apparent anesthesia complications

## 2019-10-11 NOTE — Interval H&P Note (Signed)
History and Physical Interval Note:  10/11/2019 1:53 PM  Nathan [name]  has presented today for surgery, with the diagnosis of nausea and vomiting.  The various methods of treatment have been discussed with the patient and family. After consideration of risks, benefits and other options for treatment, the patient has consented to  Procedure(s): ESOPHAGOGASTRODUODENOSCOPY (EGD) WITH PROPOFOL (N/A) as a surgical intervention.  The patient's history has been reviewed, patient examined, no change in status, stable for surgery.  I have reviewed the patient's chart and labs.  Questions were answered to the patient's satisfaction.     Landry Dyke

## 2019-10-12 ENCOUNTER — Inpatient Hospital Stay (HOSPITAL_COMMUNITY): Payer: Medicaid Other

## 2019-10-12 LAB — BASIC METABOLIC PANEL
Anion gap: 9 (ref 5–15)
BUN: 13 mg/dL (ref 6–20)
CO2: 26 mmol/L (ref 22–32)
Calcium: 8 mg/dL — ABNORMAL LOW (ref 8.9–10.3)
Chloride: 106 mmol/L (ref 98–111)
Creatinine, Ser: 2.1 mg/dL — ABNORMAL HIGH (ref 0.61–1.24)
GFR calc Af Amer: 40 mL/min — ABNORMAL LOW (ref >60)
GFR calc non Af Amer: 35 mL/min — ABNORMAL LOW (ref >60)
Glucose, Bld: 99 mg/dL (ref 70–99)
Potassium: 3.2 mmol/L — ABNORMAL LOW (ref 3.5–5.1)
Sodium: 141 mmol/L (ref 135–145)

## 2019-10-12 MED ORDER — AMOXICILLIN-POT CLAVULANATE 875-125 MG PO TABS: 1.0000 | ORAL_TABLET | Freq: Two times a day (BID) | ORAL | 0 refills | Status: DC

## 2019-10-12 MED ORDER — TRAMADOL HCL 50 MG PO TABS: 50.0000 mg | ORAL_TABLET | Freq: Two times a day (BID) | ORAL | 0 refills | Status: DC

## 2019-10-12 MED ORDER — PANTOPRAZOLE SODIUM 40 MG PO TBEC: 40.0000 mg | DELAYED_RELEASE_TABLET | Freq: Two times a day (BID) | ORAL | 0 refills | Status: DC

## 2019-10-12 MED ORDER — POTASSIUM CHLORIDE 20 MEQ PO PACK
40.0000 meq | PACK | Freq: Once | ORAL | Status: AC
  Administered 2019-10-12: 40 meq via ORAL
  Filled 2019-10-12: qty 2

## 2019-10-12 MED ORDER — AMLODIPINE BESYLATE 5 MG PO TABS: 10.0000 mg | ORAL_TABLET | Freq: Every day | ORAL | 1 refills | Status: DC

## 2019-10-12 NOTE — Progress Notes (Addendum)
K+ was lower today 3.2, replaced potassium po. Patient denied nausea, he stated that he had nausea when he coughing, now oral suction connected, so no problem. Patient wanted to go home today. Removed PIV access x 2 and received discharge instructions. Patient had regular diet at lunch time, no complain of nausea or vomiting.  [facility] [employer]

## 2019-10-12 NOTE — Progress Notes (Signed)
Subjective: Feels better.  Able to tolerate solid foods.  Objective: Vital signs in last 24 hours: Temp:  [97.7 F (36.5 C)-98.6 F (37 C)] 98.6 F (37 C) (12/05 1212) Pulse Rate:  [42-53] 47 (12/05 0828) Resp:  [10-21] 19 (12/05 1212) BP: (133-159)/(72-95) 142/81 (12/05 1212) SpO2:  [98 %-99 %] 98 % (12/05 0828) Weight:  [84.1 kg] 84.1 kg (12/05 0629) Weight change: 0.9 kg Last BM Date: 10/09/19  PE: GEN: NAD  Lab Results: CBC    Component Value Date/Time   WBC 15.3 (H) 10/08/2019 0258   RBC 4.64 10/08/2019 0258   HGB 13.7 10/08/2019 0258   HCT 40.6 10/08/2019 0258   PLT 328 10/08/2019 0258   MCV 87.5 10/08/2019 0258   MCH 29.5 10/08/2019 0258   MCHC 33.7 10/08/2019 0258   RDW 12.9 10/08/2019 0258   CMP     Component Value Date/Time   NA 141 10/12/2019 0219   K 3.2 (L) 10/12/2019 0219   CL 106 10/12/2019 0219   CO2 26 10/12/2019 0219   GLUCOSE 99 10/12/2019 0219   BUN 13 10/12/2019 0219   CREATININE 2.10 (H) 10/12/2019 0219   CALCIUM 8.0 (L) 10/12/2019 0219   PROT 5.2 (L) 10/06/2019 0217   ALBUMIN 2.0 (L) 10/06/2019 0217   AST 15 10/06/2019 0217   ALT 11 10/06/2019 0217   ALKPHOS 61 10/06/2019 0217   BILITOT 0.5 10/06/2019 0217   GFRNONAA 35 (L) 10/12/2019 0219   GFRAA 40 (L) 10/12/2019 0219   Assessment:  1.  Empyema, managed by CVTS. 2.  Post-operative nausea and vomiting.  Persistent.  Improved since yesterday afternoon. 3.  Esophagitis and duodenitis seen on yesterday's endoscopy; unclear how or if this is any way related to patient's GI symptoms.  Plan:  1.  Unclear if patient's dramatic improvement is due to PPI or not; but given this change in therapy and his improvement, would send patient home on PPI (pantoprazole 40 mg po qd qac) and continue this for the next 6-8 weeks. 2.  Can follow-up with PCP.  PRN follow-up with [facility], but happy to see if GI symptoms recur/worsen. 3.  [facility] will sign-off; please call with questions; thank you for  the consultation.   Nathan, [name] 10/12/2019, 1:39 PM   Cell [phone] If no answer or after 5 PM call [phone]

## 2019-10-12 NOTE — Progress Notes (Signed)
      [address]       [place] [address]             [phone]      1 Day Post-Op Procedure(s) (LRB): ESOPHAGOGASTRODUODENOSCOPY (EGD) WITH PROPOFOL (N/A) BIOPSY Subjective: Esophagitis on scope , now on PPI- feels better with no current nause  Objective: Vital signs in last 24 hours: Temp:  [97.7 F (36.5 C)-98.4 F (36.9 C)] 98 F (36.7 C) (12/05 0828) Pulse Rate:  [42-55] 47 (12/05 0828) Cardiac Rhythm: Sinus bradycardia (12/05 0828) Resp:  [10-21] 18 (12/05 0828) BP: (133-178)/(72-95) 145/80 (12/05 0828) SpO2:  [98 %-99 %] 98 % (12/05 0828) Weight:  [84.1 kg] 84.1 kg (12/05 0629)  Hemodynamic parameters for last 24 hours:    Intake/Output from previous day: 12/04 0701 - 12/05 0700 In: 1620.9 [I.V.:1220.8; IV Piggyback:400.1] Out: 1425 [Urine:1425] Intake/Output this shift: Total I/O In: 3 [I.V.:3] Out: -   General appearance: alert, cooperative and no distress Heart: regular rate and rhythm and brady Lungs: dim right lower than left base Abdomen: benign Extremities: no edema or calf tnderness Wound: well healed, staples intact  Lab Results: No results for input(s): WBC, HGB, HCT, PLT in the last 72 hours. BMET:  Recent Labs    10/11/19 0244 10/12/19 0219  NA 139 141  K 3.0* 3.2*  CL 105 106  CO2 24 26  GLUCOSE 109* 99  BUN 13 13  CREATININE 2.00* 2.10*  CALCIUM 8.0* 8.0*    PT/INR: No results for input(s): LABPROT, INR in the last 72 hours. ABG    Component Value Date/Time   PHART 7.349 (L) 10/05/2019 0502   HCO3 22.2 10/05/2019 0502   TCO2 23 10/05/2019 0502   ACIDBASEDEF 3.0 (H) 10/05/2019 0502   O2SAT 97.0 10/05/2019 0502   CBG (last 3)  No results for input(s): GLUCAP in the last 72 hours.  Meds Scheduled Meds: . amLODipine  5 mg Oral Daily  . docusate sodium  200 mg Oral Daily  . mouth rinse  15 mL Mouth Rinse BID  . pantoprazole (PROTONIX) IV  40 mg Intravenous Q12H  . sodium chloride flush  3 mL  Intravenous Q12H   Continuous Infusions: . sodium chloride Stopped (10/05/19 1312)  . sodium chloride    . ampicillin-sulbactam (UNASYN) IV 3 g (10/12/19 0931)  . dextrose 5 % and 0.9 % NaCl with KCl 20 mEq/L 50 mL/hr at 10/12/19 0000  . lactated ringers Stopped (10/11/19 1426)   PRN Meds:.[CANCELED] Place/Maintain arterial line **AND** sodium chloride, sodium chloride, ipratropium-albuterol, ondansetron (ZOFRAN) IV, promethazine, sodium chloride flush, traMADol  Xrays No results found.  Assessment/Plan: S/P Procedure(s) (LRB): ESOPHAGOGASTRODUODENOSCOPY (EGD) WITH PROPOFOL (N/A) BIOPSY  1 doing well , stable for d/c 2 will give 40 of potassium before he leaves 3 will schedule staple removal for next week 4 home on PPI and augmentin  LOS: 10 days    [facility] 10/12/2019 Pager [phone]- not for patient use

## 2019-10-12 NOTE — Care Management (Signed)
CM provided patient with  [facility] letter for prescriptions.

## 2019-10-14 ENCOUNTER — Encounter (HOSPITAL_COMMUNITY): Payer: Self-pay | Admitting: Gastroenterology

## 2019-10-15 LAB — SURGICAL PATHOLOGY

## 2019-10-15 NOTE — Anesthesia Postprocedure Evaluation (Signed)
Anesthesia Post Note  Patient: Nathan [name]  Procedure(s) Performed: ESOPHAGOGASTRODUODENOSCOPY (EGD) WITH PROPOFOL (N/A ) BIOPSY     Patient location during evaluation: PACU Anesthesia Type: MAC Level of consciousness: awake and alert Pain management: pain level controlled Vital Signs Assessment: post-procedure vital signs reviewed and stable Respiratory status: spontaneous breathing, nonlabored ventilation, respiratory function stable and patient connected to nasal cannula oxygen Cardiovascular status: stable and blood pressure returned to baseline Postop Assessment: no apparent nausea or vomiting Anesthetic complications: no    Last Vitals:  Vitals:   10/12/19 0828 10/12/19 1212  BP: (!) 145/80 (!) 142/81  Pulse: (!) 47   Resp: 18 19  Temp: 36.7 C 37 C  SpO2: 98%     Last Pain:  Vitals:   10/12/19 1212  TempSrc: Oral  PainSc:    Pain Goal: Patients Stated Pain Goal: 0 (10/12/19 0331)                 Tyshawn Ciullo

## 2019-10-16 ENCOUNTER — Encounter (INDEPENDENT_AMBULATORY_CARE_PROVIDER_SITE_OTHER): Payer: Self-pay

## 2019-10-16 ENCOUNTER — Other Ambulatory Visit: Payer: Self-pay

## 2019-10-16 DIAGNOSIS — Z4802 Encounter for removal of sutures: Secondary | ICD-10-CM

## 2019-10-22 ENCOUNTER — Other Ambulatory Visit: Payer: Self-pay | Admitting: Surgery

## 2019-10-22 DIAGNOSIS — J90 Pleural effusion, not elsewhere classified: Secondary | ICD-10-CM

## 2019-10-23 ENCOUNTER — Ambulatory Visit (INDEPENDENT_AMBULATORY_CARE_PROVIDER_SITE_OTHER): Payer: Self-pay | Admitting: Surgery

## 2019-10-23 ENCOUNTER — Encounter: Payer: Self-pay | Admitting: Surgery

## 2019-10-23 ENCOUNTER — Other Ambulatory Visit: Payer: Self-pay

## 2019-10-23 ENCOUNTER — Ambulatory Visit
Admission: RE | Admit: 2019-10-23 | Discharge: 2019-10-23 | Disposition: A | Discharge status: Home / Self Care | Payer: Self-pay | Source: Ambulatory Visit | Attending: Surgery | Admitting: Surgery

## 2019-10-23 VITALS — BP 146/87 | HR 61 | Temp 98.1°F | Resp 16 | Ht 71.0 in | Wt 185.0 lb

## 2019-10-23 DIAGNOSIS — J869 Pyothorax without fistula: Secondary | ICD-10-CM

## 2019-10-23 DIAGNOSIS — J90 Pleural effusion, not elsewhere classified: Secondary | ICD-10-CM

## 2019-10-23 DIAGNOSIS — Z09 Encounter for follow-up examination after completed treatment for conditions other than malignant neoplasm: Secondary | ICD-10-CM

## 2019-10-23 NOTE — Progress Notes (Signed)
HPI: Patient returns for routine postoperative follow-up having undergone right muscle-sparing thoracotomy for drainage of an empyema and decortication of the right lung on 10/04/2019.  His cultures grew Streptococcus intermedius.  He had preoperative acute nonoliguric kidney injury at the time of admission most likely due to a combination of dehydration from his illness and use of Aleve around-the-clock for several weeks.  His creatinine peaked at 2.87 and gradually decreased with hydration and treatment of his sepsis.  His creatinine was 2.1 at the time of discharge. The patient's early postoperative recovery while in the hospital was notable for development of persistent nausea and vomiting.  He was seen by GI medicine and underwent upper endoscopy showing some esophagitis and duodenitis most likely due to his use of Aleve in combination with his acute illness.  He was discharged home on amoxicillin clavulanate. Since hospital discharge the patient reports that his nausea and vomiting resolved as soon as he went home.  He has been eating well.  He has some chest wall discomfort but overall feels well.  He denies any shortness of breath or cough.  He has no fever or chills.   Current Outpatient Medications  Medication Sig Dispense Refill  . amLODipine (NORVASC) 5 MG tablet Take 2 tablets (10 mg total) by mouth daily. 30 tablet 1  . amoxicillin-clavulanate (AUGMENTIN) 875-125 MG tablet Take 1 tablet by mouth 2 (two) times daily. 28 tablet 0  . naproxen sodium (ALEVE) 220 MG tablet Take 440 mg by mouth daily as needed (pain).    . pantoprazole (PROTONIX) 40 MG tablet Take 1 tablet (40 mg total) by mouth 2 (two) times daily before a meal. 60 tablet 0  . traMADol (ULTRAM) 50 MG tablet Take 1 tablet (50 mg total) by mouth 2 (two) times daily. 14 tablet 0   No current facility-administered medications for this visit.    Physical Exam: BP (!) 146/87 (BP Location: Left Arm, Patient Position:  Sitting, Cuff Size: Normal)   Pulse 61   Temp 98.1 F (36.7 C)   Resp 16   Ht 5\' 11"  (1.803 m)   Wt 185 lb (83.9 kg)   SpO2 98% Comment: RA  BMI 25.80 kg/m  He looks well. Cardiac exam shows regular rate and rhythm with normal heart sounds. Lungs are clear. The right chest incisions are healing well.  Diagnostic Tests:  CLINICAL DATA:  Pleural effusion  EXAM: CHEST - 2 VIEW  COMPARISON:  October 12, 2019  FINDINGS: There is a fairly small pleural effusion on the right with right base atelectasis. There is minimal left base atelectasis. The lungs elsewhere are clear. The heart size and pulmonary vascularity are normal. No adenopathy. There is an old healed fracture of the posterior left fifth rib. No pneumothorax.  IMPRESSION: Fairly small right pleural effusion with atelectatic change in the right base region in the area of effusion. Mild associated consolidation in this area cannot be entirely excluded.  Lungs elsewhere clear except for minimal left base atelectasis.  Heart size normal.  No adenopathy evident.  No pneumothorax.   Electronically Signed   By: October 14, 2019 III M.D.   On: 10/23/2019 11:20   Impression:  Overall I think he is making a good recovery following his surgery.  I told him to continue his antibiotics until the prescription has completed.  I strongly advised him to completely abstain from smoking but he has returned to smoking since going home.  I would expect his kidney function  to return to normal with time.  I advised him against taking Aleve or any other nonsteroidal anti-inflammatory agents.  He said that he has been using Tylenol for pain.  Plan:  He will return to see me if he has any problems with his incisions.   Gaye Pollack, MD [facility] [phone]

## 2019-11-06 LAB — FUNGAL ORGANISM REFLEX

## 2019-11-06 LAB — FUNGUS CULTURE WITH STAIN

## 2019-11-06 LAB — FUNGUS CULTURE RESULT

## 2019-11-16 LAB — ACID FAST CULTURE WITH REFLEXED SENSITIVITIES (MYCOBACTERIA): Acid Fast Culture: NEGATIVE

## 2020-01-15 DIAGNOSIS — G060 Intracranial abscess and granuloma: Secondary | ICD-10-CM | POA: Insufficient documentation

## 2020-02-07 DIAGNOSIS — E041 Nontoxic single thyroid nodule: Secondary | ICD-10-CM | POA: Insufficient documentation

## 2020-02-11 DIAGNOSIS — Z9889 Other specified postprocedural states: Secondary | ICD-10-CM | POA: Insufficient documentation

## 2020-08-18 DIAGNOSIS — G629 Polyneuropathy, unspecified: Secondary | ICD-10-CM | POA: Insufficient documentation

## 2021-01-26 IMAGING — CR DG CHEST 1V PORT
1 series · 1 of 1 positions shown · non-contrast
Comparison: 10/03/2019

CLINICAL DATA: Status post right thoracotomy

EXAM:
PORTABLE CHEST 1 VIEW

[AP]
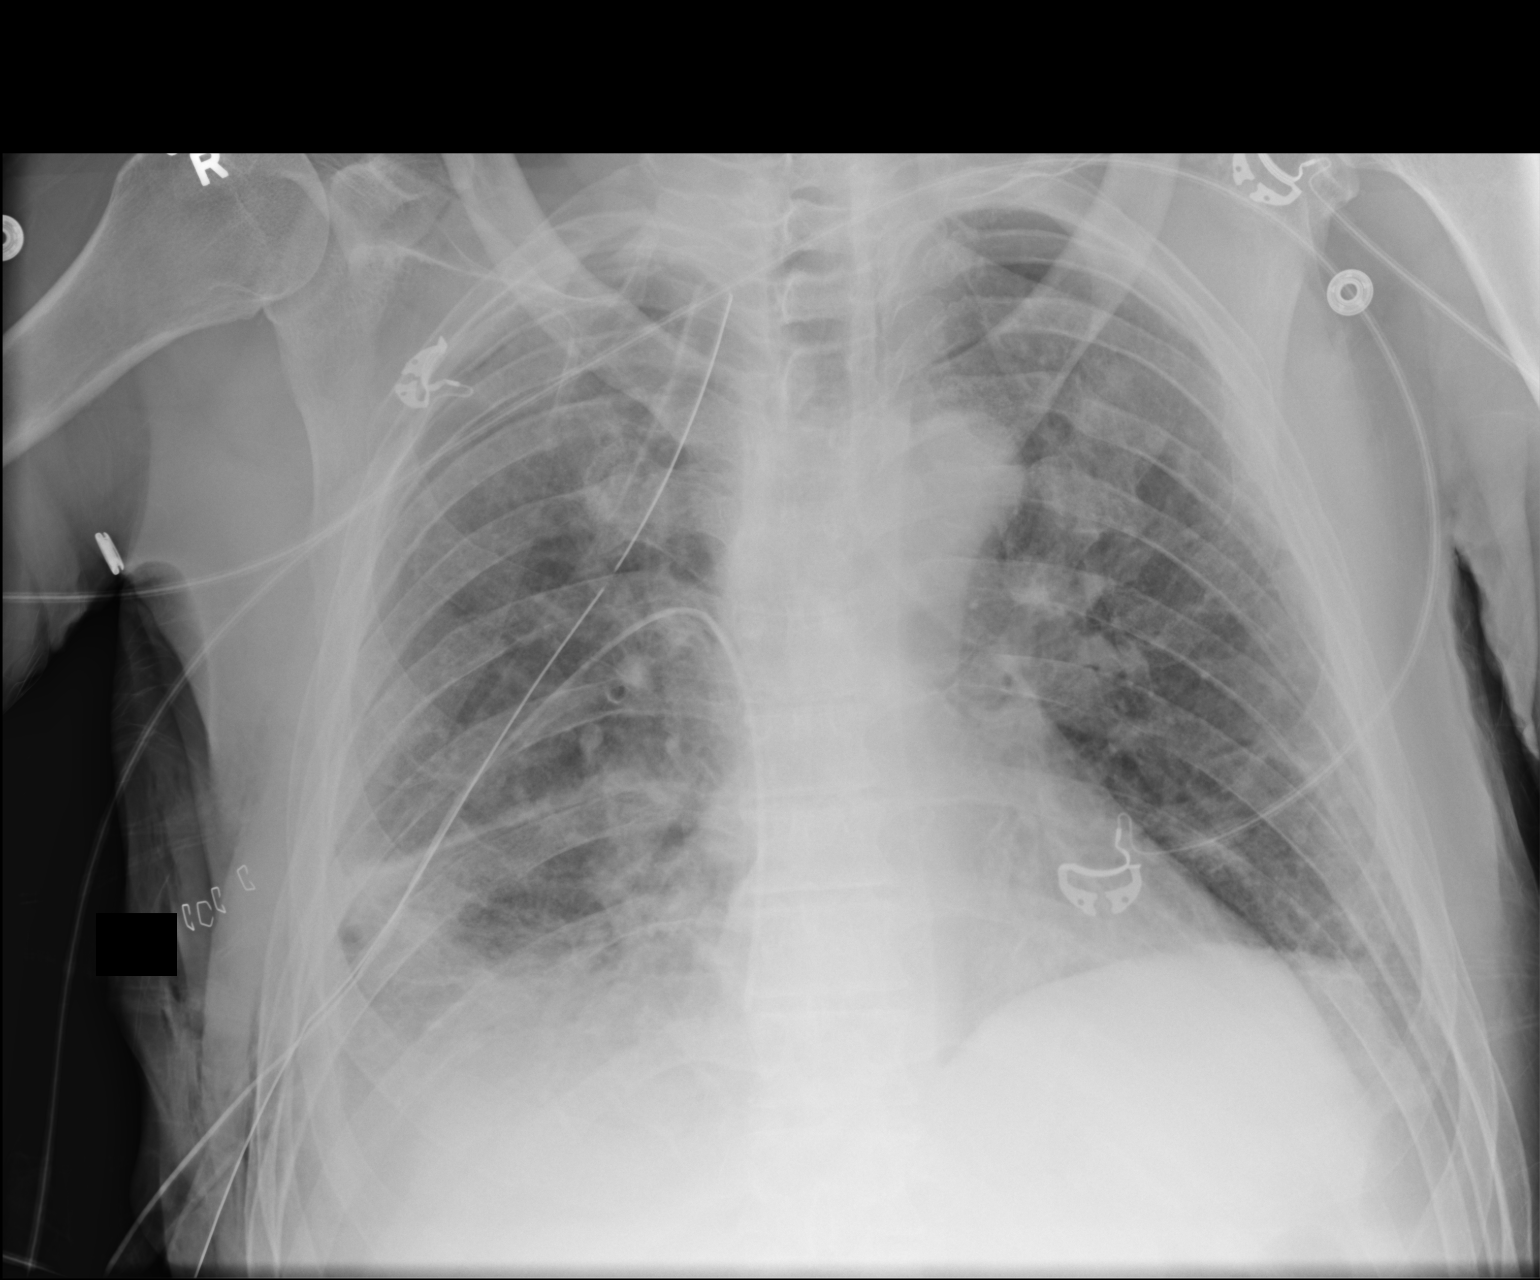

[1 of 1 positions shown; findings below may reference images not displayed]

FINDINGS: Interval postoperative findings of right thoracotomy with near
complete resolution of a previously seen loculated pleural effusion
about the right lung base. Right-sided chest tubes are in position
without significant pneumothorax appreciated. There is mild, diffuse
interstitial opacity of the left lung, consistent with edema and/or
partial atelectasis on low volume portable examination. The heart
and mediastinum are unremarkable.
IMPRESSION: Interval postoperative findings of right thoracotomy with near
complete resolution of a previously seen loculated pleural effusion
about the right lung base. Right-sided chest tubes are in position
without significant pneumothorax appreciated.

## 2021-01-29 IMAGING — DX DG CHEST 1V PORT
1 series · 1 of 1 positions shown · non-contrast
Comparison: 10/05/2019, 10/04/2019, 10/03/2019.  CT 10/01/2019.

CLINICAL DATA: Chest tube.  History of pneumothorax.  Sore chest.

EXAM:
PORTABLE CHEST 1 VIEW

[chest]
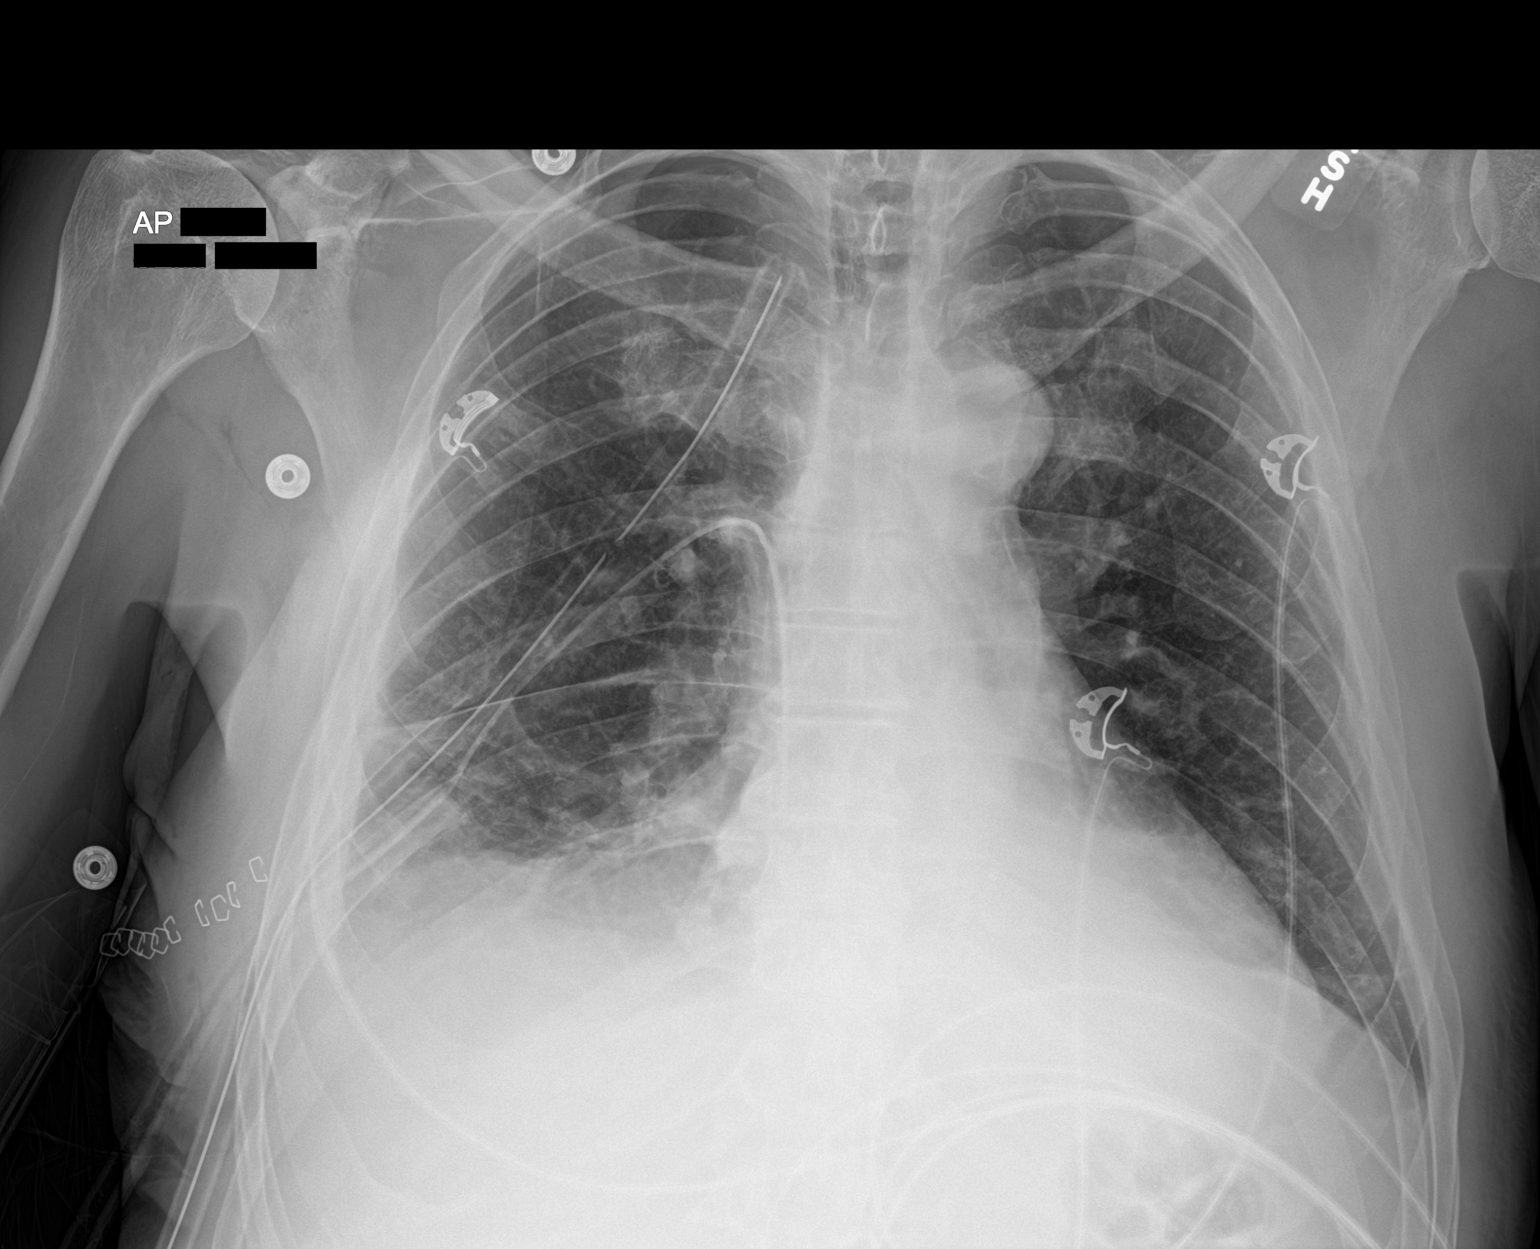

[1 of 1 positions shown; findings below may reference images not displayed]

FINDINGS: Two right chest tubes in stable position. No pneumothorax. Heart
size stable. Significant interval improved aeration right lung base.
Mild residual bibasilar atelectasis/infiltrates. No significant
pleural effusion. No acute bony abnormality.
IMPRESSION: 1.  Two right chest tubes in stable position.  No pneumothorax.

2. Significant improvement in aeration and right lung base. Mild
residual bibasilar atelectasis/infiltrates. No significant pleural
effusion.

## 2021-02-01 IMAGING — CR DG CHEST 2V
2 series · 2 of 2 positions shown · non-contrast
Comparison: Chest radiograph from one day prior.

CLINICAL DATA: Follow-up pneumothorax

EXAM:
CHEST - 2 VIEW

[chest pa]
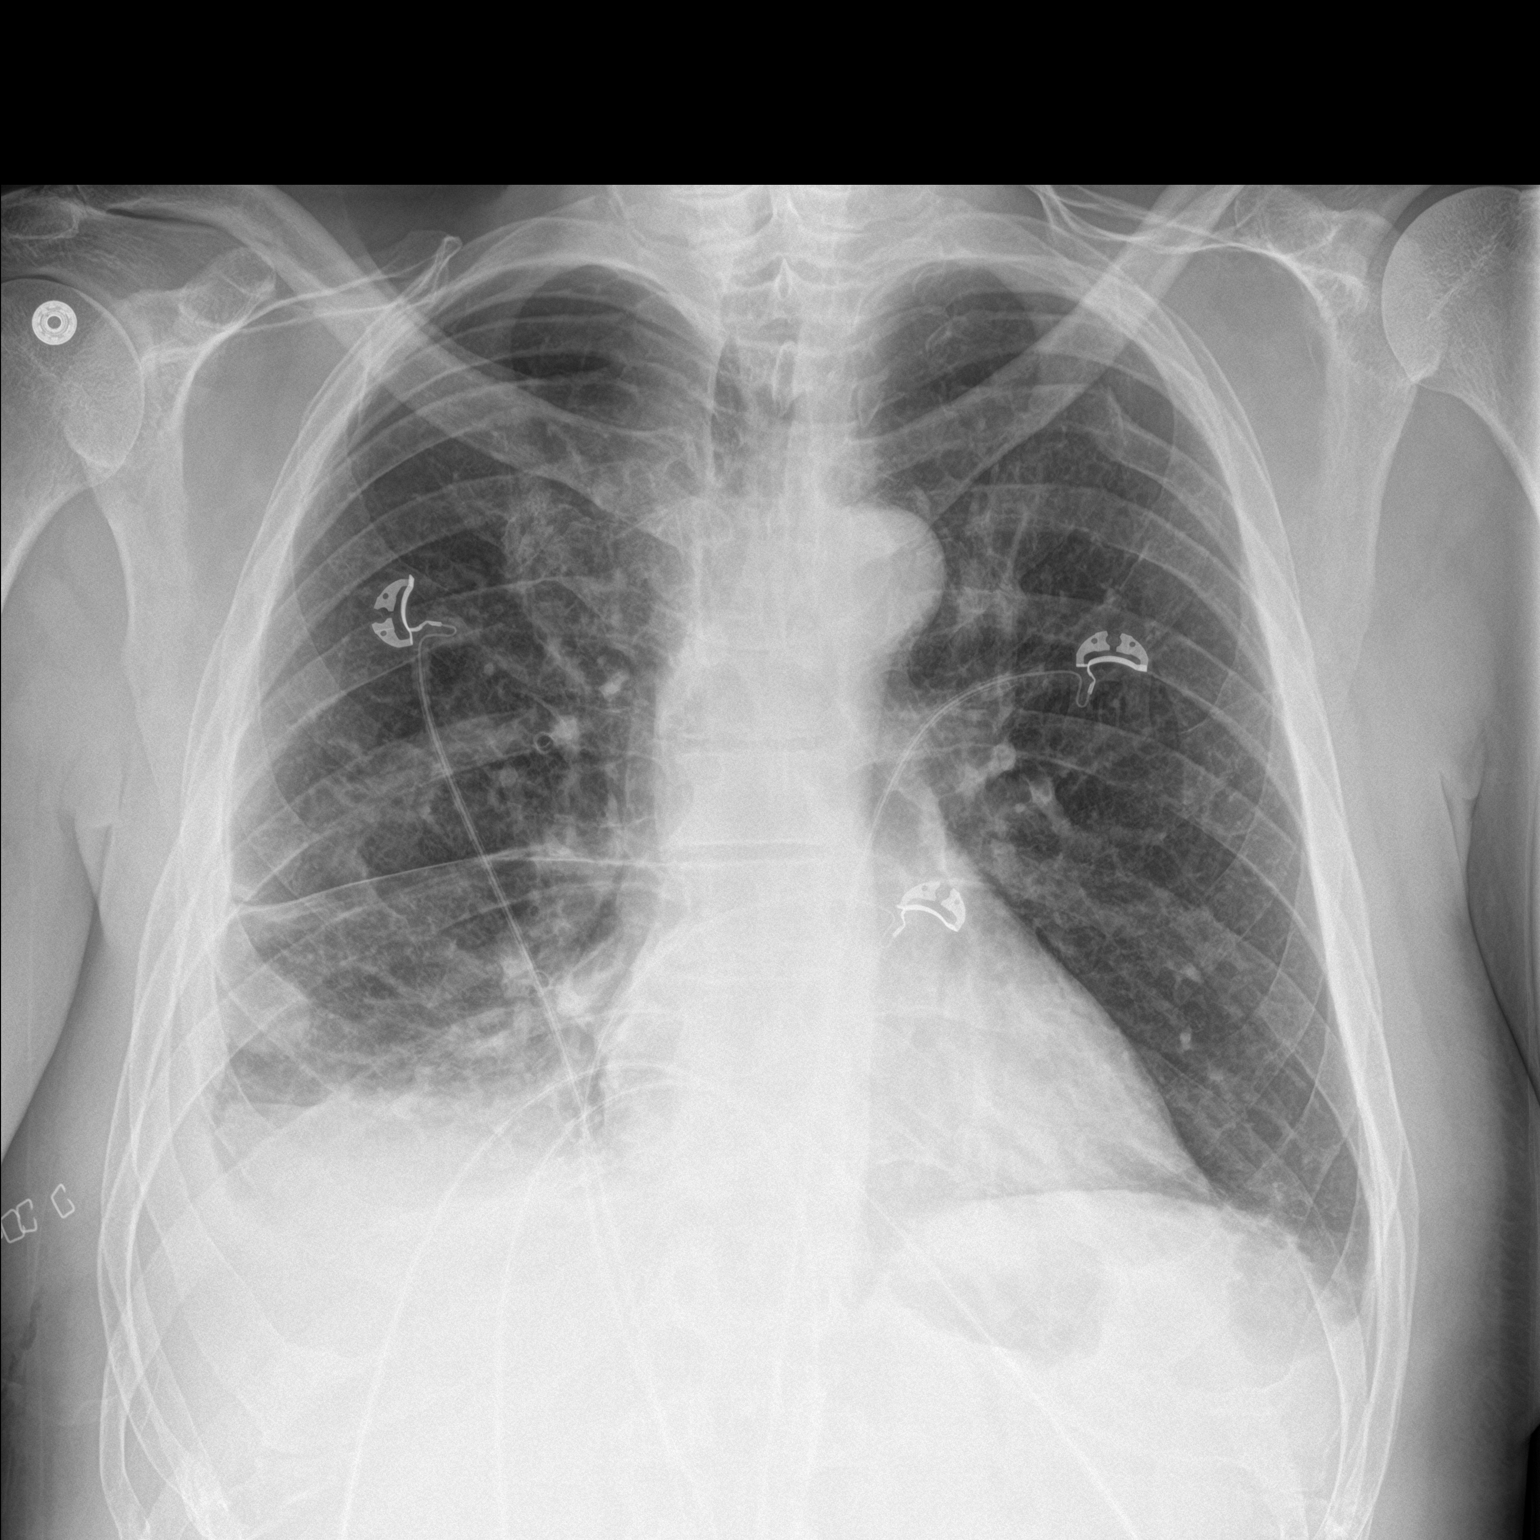

[chest lat]
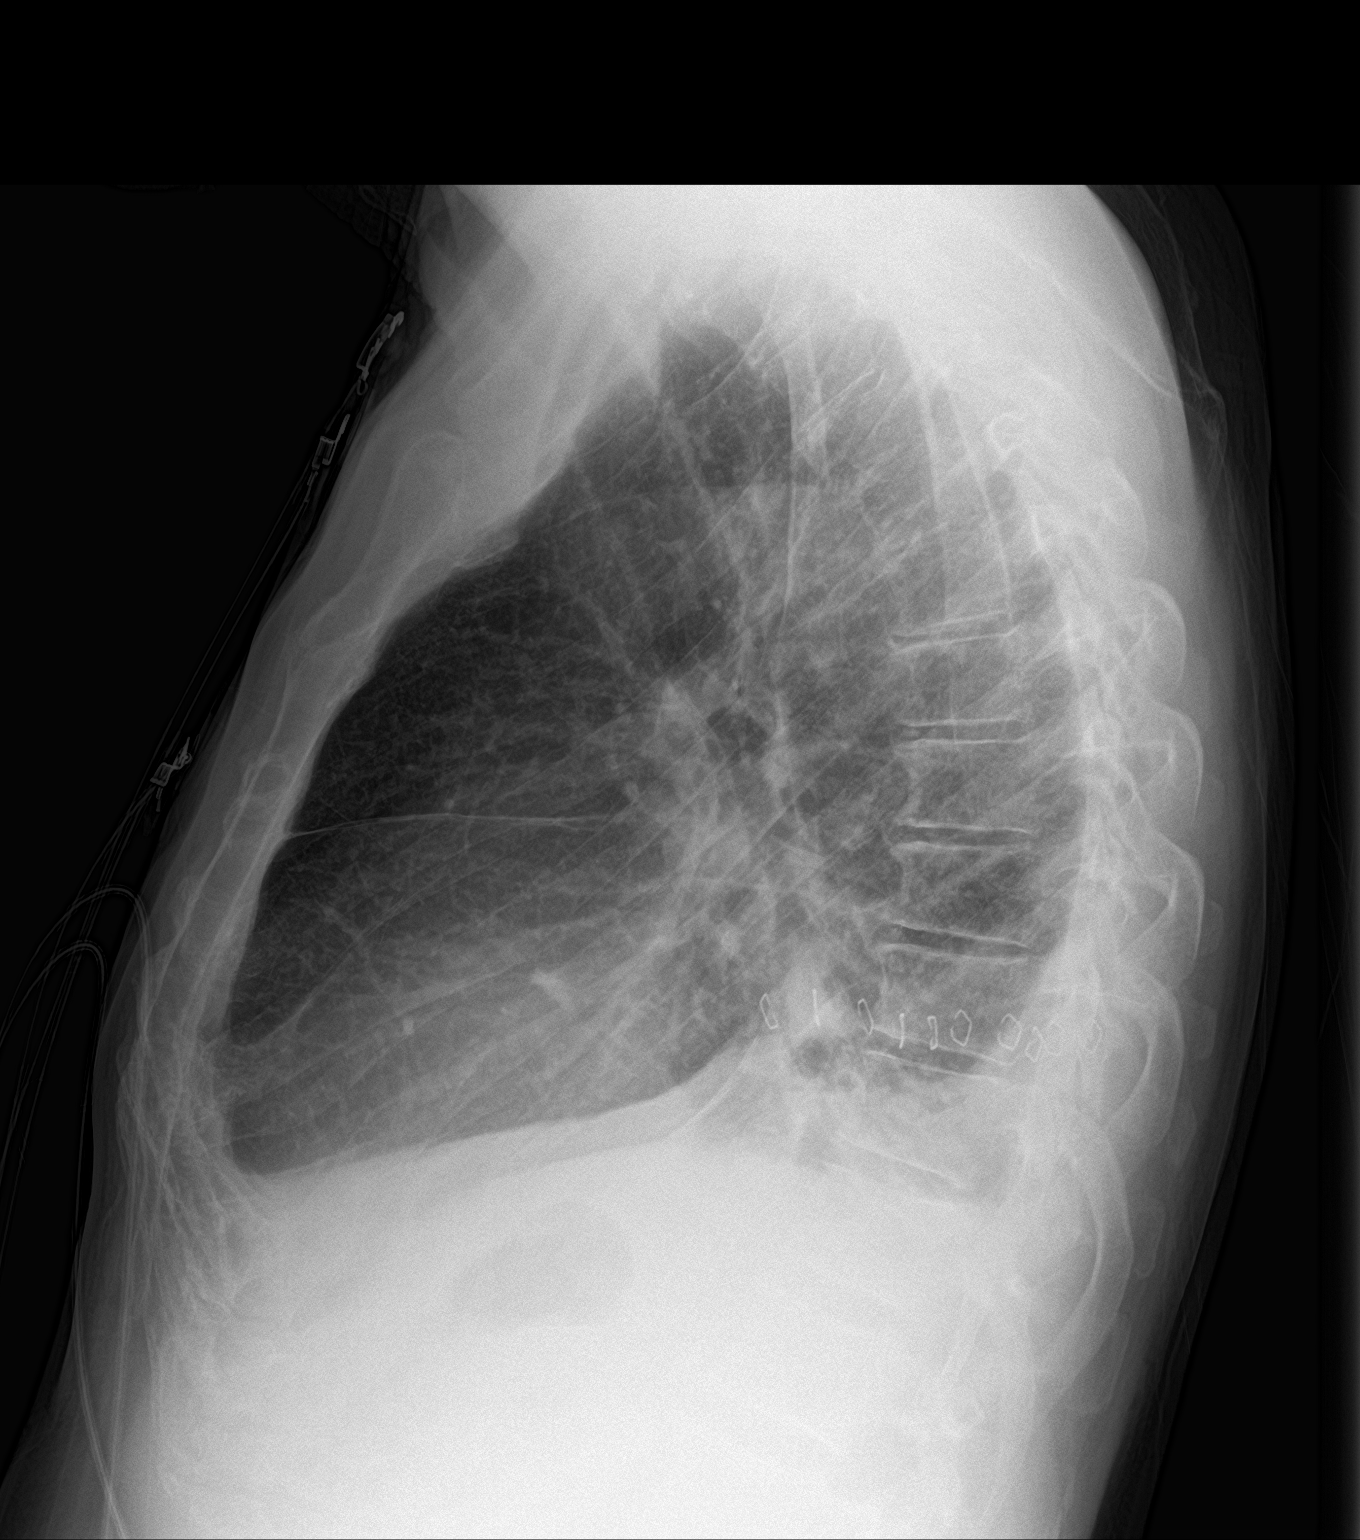

[2 of 2 positions shown; findings below may reference images not displayed]

FINDINGS: Stable cardiomediastinal silhouette with normal heart size. Tiny
right apical pneumothorax, slightly decreased. No left pneumothorax.
Small right costophrenic angle pleural effusion is stable. Stable
trace left pleural effusion. No pulmonary edema. Similar mild
bibasilar patchy opacities, right greater than left. Skin staples
overlie the lower lateral right chest wall.
IMPRESSION: 1. Small right hydropneumothorax with decreased tiny right apical
pneumothorax component.
2. Stable trace left pleural effusion.
3. Stable mild patchy bibasilar lung opacities, right greater than
left, favor atelectasis.

## 2021-11-25 ENCOUNTER — Ambulatory Visit: Payer: Medicaid Other | Admitting: Family Medicine

## 2021-11-25 ENCOUNTER — Other Ambulatory Visit: Payer: Self-pay

## 2021-11-25 ENCOUNTER — Encounter: Payer: Self-pay | Admitting: Family Medicine

## 2021-11-25 VITALS — BP 110/70 | HR 78 | Temp 97.9°F | Resp 16 | Ht 69.0 in | Wt 167.0 lb

## 2021-11-25 DIAGNOSIS — R29890 Loss of height: Secondary | ICD-10-CM | POA: Diagnosis not present

## 2021-11-25 DIAGNOSIS — Z8709 Personal history of other diseases of the respiratory system: Secondary | ICD-10-CM

## 2021-11-25 DIAGNOSIS — G060 Intracranial abscess and granuloma: Secondary | ICD-10-CM

## 2021-11-25 DIAGNOSIS — G5793 Unspecified mononeuropathy of bilateral lower limbs: Secondary | ICD-10-CM

## 2021-11-25 DIAGNOSIS — Z23 Encounter for immunization: Secondary | ICD-10-CM | POA: Diagnosis not present

## 2021-11-25 DIAGNOSIS — F17210 Nicotine dependence, cigarettes, uncomplicated: Secondary | ICD-10-CM

## 2021-11-25 DIAGNOSIS — R5383 Other fatigue: Secondary | ICD-10-CM

## 2021-11-25 DIAGNOSIS — E782 Mixed hyperlipidemia: Secondary | ICD-10-CM | POA: Diagnosis not present

## 2021-11-25 DIAGNOSIS — Z9889 Other specified postprocedural states: Secondary | ICD-10-CM
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
## 2021-11-27 LAB — LIPID PANEL
Chol/HDL Ratio: 4 ratio (ref 0.0–5.0)
Cholesterol, Total: 182 mg/dL (ref 100–199)
HDL: 46 mg/dL (ref >39)
LDL Chol Calc (NIH): 121 mg/dL — ABNORMAL HIGH (ref 0–99)
Triglycerides: 82 mg/dL (ref 0–149)
VLDL Cholesterol Cal: 15 mg/dL (ref 5–40)

## 2021-11-27 LAB — CBC WITH DIFFERENTIAL/PLATELET
Basophils Absolute: 0.1 10*3/uL (ref 0.0–0.2)
Basos: 1 %
EOS (ABSOLUTE): 0.3 10*3/uL (ref 0.0–0.4)
Eos: 4 %
Hematocrit: 49.1 % (ref 37.5–51.0)
Hemoglobin: 16.6 g/dL (ref 13.0–17.7)
Immature Grans (Abs): 0 10*3/uL (ref 0.0–0.1)
Immature Granulocytes: 0 %
Lymphocytes Absolute: 1.3 10*3/uL (ref 0.7–3.1)
Lymphs: 18 %
MCH: 28.9 pg (ref 26.6–33.0)
MCHC: 33.8 g/dL (ref 31.5–35.7)
MCV: 86 fL (ref 79–97)
Monocytes Absolute: 0.5 10*3/uL (ref 0.1–0.9)
Monocytes: 7 %
Neutrophils Absolute: 5 10*3/uL (ref 1.4–7.0)
Neutrophils: 70 %
Platelets: 287 10*3/uL (ref 150–450)
RBC: 5.74 x10E6/uL (ref 4.14–5.80)
RDW: 12.9 % (ref 11.6–15.4)
WBC: 7.2 10*3/uL (ref 3.4–10.8)

## 2021-11-27 LAB — COMPREHENSIVE METABOLIC PANEL
ALT: 12 IU/L (ref 0–44)
AST: 12 IU/L (ref 0–40)
Albumin/Globulin Ratio: 1.5 (ref 1.2–2.2)
Albumin: 4.6 g/dL (ref 3.8–4.9)
Alkaline Phosphatase: 110 IU/L (ref 44–121)
BUN/Creatinine Ratio: 10 (ref 9–20)
BUN: 9 mg/dL (ref 6–24)
Bilirubin Total: 0.5 mg/dL (ref 0.0–1.2)
CO2: 26 mmol/L (ref 20–29)
Calcium: 9.7 mg/dL (ref 8.7–10.2)
Chloride: 100 mmol/L (ref 96–106)
Creatinine, Ser: 0.92 mg/dL (ref 0.76–1.27)
Globulin, Total: 3.1 g/dL (ref 1.5–4.5)
Glucose: 87 mg/dL (ref 70–99)
Potassium: 4.9 mmol/L (ref 3.5–5.2)
Sodium: 140 mmol/L (ref 134–144)
Total Protein: 7.7 g/dL (ref 6.0–8.5)
eGFR: 98 mL/min/{1.73_m2} (ref >59)

## 2021-11-27 LAB — B12 AND FOLATE PANEL
Folate: 10.5 ng/mL (ref >3.0)
Vitamin B-12: 711 pg/mL (ref 232–1245)

## 2021-11-27 LAB — VITAMIN D 25 HYDROXY (VIT D DEFICIENCY, FRACTURES): Vit D, 25-Hydroxy: 17.1 ng/mL — ABNORMAL LOW (ref 30.0–100.0)

## 2021-11-27 LAB — METHYLMALONIC ACID, SERUM: Methylmalonic Acid: 128 nmol/L (ref 0–378)

## 2021-11-27 LAB — TSH: TSH: 2.84 u[IU]/mL (ref 0.450–4.500)

## 2021-11-28 DIAGNOSIS — R29890 Loss of height: Secondary | ICD-10-CM | POA: Insufficient documentation

## 2021-11-28 DIAGNOSIS — Z23 Encounter for immunization: Secondary | ICD-10-CM | POA: Insufficient documentation

## 2021-11-28 DIAGNOSIS — R5383 Other fatigue: Secondary | ICD-10-CM | POA: Insufficient documentation

## 2021-11-28 DIAGNOSIS — G5793 Unspecified mononeuropathy of bilateral lower limbs: Secondary | ICD-10-CM | POA: Insufficient documentation

## 2021-11-28 NOTE — Progress Notes (Signed)
Blood count normal.  Liver function normal.  Kidney function normal.  Thyroid function normal.  Cholesterol: LDL little high. Trigs and HDL good. Low fat diet. Exercise. Vitamin d low. Recommend vitamin D 93235 weekly. Recheck in 3 months.  B12 and folate levels are normal  No signs of diabetes.

## 2021-11-28 NOTE — Assessment & Plan Note (Addendum)
Check lipid panel.  Recommend continue to work on eating healthy diet and exercise.

## 2021-11-28 NOTE — Assessment & Plan Note (Signed)
Check labs 

## 2021-11-28 NOTE — Assessment & Plan Note (Signed)
Order bone density.  ° °

## 2021-11-28 NOTE — Assessment & Plan Note (Addendum)
History. Has not been reassessed in nearly 2 years.  Check mri of brain

## 2021-11-29 ENCOUNTER — Telehealth: Payer: Self-pay | Admitting: Family Medicine

## 2021-11-29 ENCOUNTER — Other Ambulatory Visit: Payer: Self-pay

## 2021-11-29 MED ORDER — GABAPENTIN 300 MG PO CAPS: 300.0000 mg | ORAL_CAPSULE | Freq: Three times a day (TID) | ORAL | 2 refills | Status: AC

## 2021-11-29 MED ORDER — VITAMIN D (ERGOCALCIFEROL) 1.25 MG (50000 UNIT) PO CAPS: 50000.0000 [IU] | ORAL_CAPSULE | ORAL | 0 refills | Status: AC

## 2021-11-29 NOTE — Telephone Encounter (Signed)
° °  Nathan [name] has been scheduled for the following appointment:  WHAT:CHEST CT AND MRI OF BRAIN WHERE: [facility] DATE: 12/03/21 TIME: CT SCAN CHECK IN @ 2:30 PM, MRI CHECK IN @ 3:30   A message has been left for the patient.

## 2021-12-12 ENCOUNTER — Encounter: Payer: Self-pay | Admitting: Family Medicine

## 2021-12-12 DIAGNOSIS — Z8709 Personal history of other diseases of the respiratory system: Secondary | ICD-10-CM | POA: Insufficient documentation

## 2021-12-12 NOTE — Assessment & Plan Note (Signed)
Recommend cessation. ?

## 2021-12-12 NOTE — Assessment & Plan Note (Signed)
Order ct of chest.  ?

## 2021-12-12 NOTE — Assessment & Plan Note (Signed)
Check labs 

## 2021-12-12 NOTE — Assessment & Plan Note (Signed)
Needs mri brain

## 2022-01-10 ENCOUNTER — Other Ambulatory Visit: Payer: Self-pay

## 2022-01-10 DIAGNOSIS — Z8709 Personal history of other diseases of the respiratory system: Secondary | ICD-10-CM

## 2022-01-10 DIAGNOSIS — G060 Intracranial abscess and granuloma: Secondary | ICD-10-CM

## 2022-01-10 DIAGNOSIS — Z9889 Other specified postprocedural states: Secondary | ICD-10-CM

## 2024-03-26 ENCOUNTER — Encounter (HOSPITAL_BASED_OUTPATIENT_CLINIC_OR_DEPARTMENT_OTHER): Payer: Self-pay | Admitting: Emergency Medicine

## 2024-03-26 ENCOUNTER — Encounter (HOSPITAL_BASED_OUTPATIENT_CLINIC_OR_DEPARTMENT_OTHER): Payer: Self-pay

## 2024-03-26 ENCOUNTER — Emergency Department (HOSPITAL_BASED_OUTPATIENT_CLINIC_OR_DEPARTMENT_OTHER)

## 2024-03-26 ENCOUNTER — Ambulatory Visit (HOSPITAL_BASED_OUTPATIENT_CLINIC_OR_DEPARTMENT_OTHER): Admission: EM | Admit: 2024-03-26 | Discharge: 2024-03-26 | Disposition: A | Discharge status: Home / Self Care

## 2024-03-26 ENCOUNTER — Emergency Department (HOSPITAL_BASED_OUTPATIENT_CLINIC_OR_DEPARTMENT_OTHER)
Admission: EM | Admit: 2024-03-26 | Discharge: 2024-03-27 | Disposition: A | Discharge status: Home / Self Care | Attending: Emergency Medicine | Admitting: Emergency Medicine

## 2024-03-26 ENCOUNTER — Other Ambulatory Visit: Payer: Self-pay

## 2024-03-26 DIAGNOSIS — E860 Dehydration: Secondary | ICD-10-CM | POA: Insufficient documentation

## 2024-03-26 DIAGNOSIS — M546 Pain in thoracic spine: Secondary | ICD-10-CM

## 2024-03-26 DIAGNOSIS — R112 Nausea with vomiting, unspecified: Secondary | ICD-10-CM | POA: Diagnosis present

## 2024-03-26 LAB — COMPREHENSIVE METABOLIC PANEL WITH GFR
ALT: 13 U/L (ref 0–44)
AST: 28 U/L (ref 15–41)
Albumin: 4.9 g/dL (ref 3.5–5.0)
Alkaline Phosphatase: 95 U/L (ref 38–126)
Anion gap: 20 — ABNORMAL HIGH (ref 5–15)
BUN: 28 mg/dL — ABNORMAL HIGH (ref 6–20)
CO2: 25 mmol/L (ref 22–32)
Calcium: 10.3 mg/dL (ref 8.9–10.3)
Chloride: 92 mmol/L — ABNORMAL LOW (ref 98–111)
Creatinine, Ser: 0.87 mg/dL (ref 0.61–1.24)
GFR, Estimated: >60 mL/min (ref >60)
Glucose, Bld: 127 mg/dL — ABNORMAL HIGH (ref 70–99)
Potassium: 3.6 mmol/L (ref 3.5–5.1)
Sodium: 138 mmol/L (ref 135–145)
Total Bilirubin: 1 mg/dL (ref 0.0–1.2)
Total Protein: 8.3 g/dL — ABNORMAL HIGH (ref 6.5–8.1)

## 2024-03-26 LAB — CBC
HCT: 52.8 % — ABNORMAL HIGH (ref 39.0–52.0)
Hemoglobin: 18.5 g/dL — ABNORMAL HIGH (ref 13.0–17.0)
MCH: 30.2 pg (ref 26.0–34.0)
MCHC: 35 g/dL (ref 30.0–36.0)
MCV: 86.3 fL (ref 80.0–100.0)
Platelets: 373 10*3/uL (ref 150–400)
RBC: 6.12 MIL/uL — ABNORMAL HIGH (ref 4.22–5.81)
RDW: 13.7 % (ref 11.5–15.5)
WBC: 20.5 10*3/uL — ABNORMAL HIGH (ref 4.0–10.5)
nRBC: 0 % (ref 0.0–0.2)

## 2024-03-26 LAB — LIPASE, BLOOD: Lipase: 12 U/L (ref 11–51)

## 2024-03-26 MED ORDER — ONDANSETRON 4 MG PO TBDP
4.0000 mg | ORAL_TABLET | Freq: Once | ORAL | Status: AC | PRN
Start: 2024-03-26 — End: 2024-03-26
  Administered 2024-03-26: 4 mg via ORAL
  Filled 2024-03-26: qty 1

## 2024-03-26 MED ORDER — LACTATED RINGERS IV BOLUS
1000.0000 mL | Freq: Once | INTRAVENOUS | Status: AC
  Administered 2024-03-26: 1000 mL via INTRAVENOUS

## 2024-03-26 MED ORDER — IOHEXOL 300 MG/ML  SOLN
100.0000 mL | Freq: Once | INTRAMUSCULAR | Status: AC | PRN
  Administered 2024-03-26: 100 mL via INTRAVENOUS

## 2024-03-26 MED ORDER — DROPERIDOL 2.5 MG/ML IJ SOLN
1.2500 mg | Freq: Once | INTRAMUSCULAR | Status: AC
  Administered 2024-03-26: 1.25 mg via INTRAVENOUS
  Filled 2024-03-26: qty 2
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
## 2024-03-26 NOTE — Discharge Instructions (Signed)
 Recommend going to the [facility] ER for further evaluation due to dehydration and abnormal lung sounds.  You need further workup with blood work, fluids and potential imaging  [name], [place], [place] [address]

## 2024-03-26 NOTE — ED Notes (Signed)
 Patient is being discharged from the Urgent Care and sent to the Emergency Department via pov . Per T. Bast, FNP, patient is in need of higher level of care due to Dehydration. Patient is aware and verbalizes understanding of plan of care.  Vitals:   03/26/24 1927  BP: 130/81  Pulse: 62  Resp: 20  Temp: 97.7 F (36.5 C)  SpO2: 95%

## 2024-03-26 NOTE — ED Notes (Signed)
Pt unable to provide UA at this time

## 2024-03-26 NOTE — ED Triage Notes (Signed)
 Pt via POV c/o vomiting after pulling a muscle in his back 2 days ago. He also has had chills x 2 days.

## 2024-03-26 NOTE — ED Provider Notes (Signed)
 [place] EM[facility]GENCY DEPARTMENT AT [facility] Provider Note   CSN: [id number] Arrival date & time: 03/26/24  2023     History  Chief Complaint  Patient presents with   Emesis    Nathan [name] is a 58 y.o. male.   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  The patient states that 2 days ago he was working on an HVAC unit and felt like he pulled something in his back.  He has been having some back pain ever since, worse with twisting and movement.  Over the last 2 days he developed epigastric discomfort as well as uncontrolled nausea and vomiting.  He does smoke cannabis for nausea.  Denies any consistent alcohol abuse.  He denies any diarrhea, denies any genitourinary symptoms.  Denies any chest pain or shortness of breath.  Denies any fevers, has had chills.  Denies any lower extremity numbness or weakness that is new, no saddle anesthesia.  He denies any hematemesis.  No coffee-ground emesis. He has had decreased urine output and his urine has been more dark. He has had decreased oral intake over the past day.  Home Medications Prior to Admission medications   Medication Sig Start Date End Date Taking? Authorizing Provider  gabapentin  (NEURONTIN ) 300 MG capsule Take 1 capsule (300 mg total) by mouth 3 (three) times daily. 11/29/21   Cox, Burleigh Carp, MD  Vitamin D , Ergocalciferol , (DRISDOL ) 1.25 MG (50000 UNIT) CAPS capsule Take 1 capsule (50,000 Units total) by mouth every 7 (seven) days. 11/29/21   Mercy Stall, MD      Allergies    Morphine     Review of Systems   Review of Systems  Gastrointestinal:  Positive for nausea and vomiting.  All other systems reviewed and are negative.   Physical Exam Updated Vital Signs BP 133/87    Pulse 68   Temp 97.7 F (36.5 C) (Oral)   Resp 17   Ht 5\' 11"  (1.803 m)   Wt 79.4 kg   SpO2 100%   BMI 24.41 kg/m  Physical Exam Vitals and nursing note reviewed.  Constitutional:      General: He is not in acute distress.    Appearance: He is well-developed.  HENT:     Head: Normocephalic and atraumatic.  Eyes:     Conjunctiva/sclera: Conjunctivae normal.  Cardiovascular:     Rate and Rhythm: Normal rate and regular rhythm.     Pulses: Normal pulses.  Pulmonary:     Effort: Pulmonary effort is normal. No respiratory distress.     Breath sounds: Normal breath sounds.  Abdominal:     Palpations: Abdomen is soft.     Tenderness: There is abdominal tenderness in the epigastric area.  Musculoskeletal:        General: No swelling.     Cervical back: Neck supple.  Skin:    General: Skin is warm and dry.     Capillary Refill: Capillary refill takes less than 2 seconds.  Neurological:     General: No focal deficit present.     Mental Status: He is alert and oriented to person, place, and time. Mental status is at baseline.     Cranial Nerves: No cranial nerve deficit.     Sensory: No sensory deficit.     Motor: No weakness.  Psychiatric:  Mood and Affect: Mood normal.     ED Results / Procedures / Treatments   Labs (all labs ordered are listed, but only abnormal results are displayed) Labs Reviewed  COMPREHENSIVE METABOLIC PANEL WITH GFR - Abnormal; Notable for the following components:      Result Value   Chloride 92 (*)    Glucose, Bld 127 (*)    BUN 28 (*)    Total Protein 8.3 (*)    Anion gap 20 (*)    All other components within normal limits  CBC - Abnormal; Notable for the following components:   WBC 20.5 (*)    RBC 6.12 (*)    Hemoglobin 18.5 (*)    HCT 52.8 (*)    All other components within normal limits  LIPASE, BLOOD  URINALYSIS, ROUTINE W REFLEX MICROSCOPIC  URINE DRUG SCREEN    EKG EKG Interpretation Date/Time:  Tuesday Mar 26 2024  22:53:30 EDT Ventricular Rate:  72 PR Interval:  152 QRS Duration:  78 QT Interval:  451 QTC Calculation: 494 R Axis:   73  Text Interpretation: Sinus rhythm Nonspecific T abnrm, anterolateral leads Borderline prolonged QT interval Confirmed by Rosealee Concha ([id number]) on 03/26/2024 11:17:45 PM  Radiology No results found.  Procedures Procedures    Medications Ordered in ED Medications  ondansetron  (ZOFRAN -ODT) disintegrating tablet 4 mg (4 mg Oral Given 03/26/24 2051)  lactated ringers  bolus 1,000 mL (1,000 mLs Intravenous New Bag/Given 03/26/24 2303)  droperidol (INAPSINE) 2.5 MG/ML injection 1.25 mg (1.25 mg Intravenous Given 03/26/24 2302)  iohexol (OMNIPAQUE) 300 MG/ML solution 100 mL (100 mLs Intravenous Contrast Given 03/26/24 2318)    ED Course/ Medical Decision Making/ A&P                                 Medical Decision Making Amount and/or Complexity of Data Reviewed Labs: ordered. Radiology: ordered.  Risk Prescription drug management.    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  The patient states that 2 days ago he was working on an HVAC unit and felt like he pulled something in his back.  He has been having some back pain ever since, worse with twisting and movement.  Over the last 2 days he developed epigastric discomfort as well as uncontrolled nausea and vomiting.  He does smoke cannabis for nausea.  Denies any consistent alcohol abuse.  He denies any diarrhea, denies any genitourinary symptoms.  Denies any chest pain or shortness of breath.  Denies any fevers, has had chills.  Denies any lower extremity numbness or weakness that is new, no saddle anesthesia.  He denies any hematemesis.  No coffee-ground emesis. He has had decreased urine output and his  urine has been more dark. He has had decreased oral intake over the past day.    On arrival, the patient was afebrile, not tachycardic or tachypneic, BP 147/94, saturating 97% on room air.  Physical exam with mild epigastric tenderness to palpation, no rebound or guarding.  Suspect viral syndrome, cannabis hyperemesis syndrome, considered gastritis, pancreatitis, bowel obstruction.  Concern for dehydration in the setting of the patient's nausea and vomiting.  Labs: CBC with evidence of hemoconcentration with a leukocytosis of 20.5, hemoglobin 18.5, CMP without significant electrolyte abnormality, normal renal function, no abnormal LFTs, normal T.  bili, lipase normal, urinalysis pending, UDS pending.  Will obtain EKG, chest x-ray, CT abdomen pelvis.  Patient vomited up Zofran  and triage.  EKG: Sinus rhythm, rate 72, nonspecific T wave changes, Qtc 494. No STEMI.  Patient without chest pain or shortness of breath, low concern for ACS, aortic dissection, PE, or other acute intrathoracic abnormality.  Patient without cough, low concern for pneumonia.  CXR: pending at signout CT Abd Pelvis: Pending at signout  Plan to rehydrate the patient in the setting of his uncontrolled nausea and vomiting, attempt p.o. challenge, follow-up results of diagnostic testing, ultimate disposition pending results of diagnostic testing and reassessment.  Signout given to Dr. Palombo at 2345.  Final Clinical Impression(s) / ED Diagnoses Final diagnoses:  Nausea and vomiting, unspecified vomiting type  Dehydration    Rx / DC Orders ED Discharge Orders     None         Rosealee Concha, MD 03/26/24 2349

## 2024-03-27 MED ORDER — LIDOCAINE VISCOUS HCL 2 % MT SOLN
15.0000 mL | Freq: Once | OROMUCOSAL | Status: AC
  Administered 2024-03-27: 15 mL via ORAL
  Filled 2024-03-27: qty 15

## 2024-03-27 MED ORDER — ONDANSETRON 8 MG PO TBDP: ORAL_TABLET | ORAL | 0 refills | Status: AC

## 2024-03-27 MED ORDER — ALUM & MAG HYDROXIDE-SIMETH 200-200-20 MG/5ML PO SUSP
30.0000 mL | Freq: Once | ORAL | Status: AC
  Administered 2024-03-27: 30 mL via ORAL
  Filled 2024-03-27: qty 30

## 2024-03-27 NOTE — ED Notes (Signed)
 Pt passed PO challenge.
